# Patient Record
Sex: Female | Born: 1955 | Race: White | Hispanic: No | Smoking: Former smoker
Health system: Southern US, Community
[De-identification: ages and names within clinical notes are randomized; demographics above are authoritative.]

## PROBLEM LIST (undated history)

## (undated) DIAGNOSIS — E1110 Type 2 diabetes mellitus with ketoacidosis without coma: Secondary | ICD-10-CM

## (undated) DIAGNOSIS — C801 Malignant (primary) neoplasm, unspecified: Secondary | ICD-10-CM

## (undated) HISTORY — PX: ABDOMINAL HYSTERECTOMY: SHX81

## (undated) HISTORY — PX: MOUTH SURGERY: SHX715

---

## 2013-10-21 ENCOUNTER — Emergency Department (HOSPITAL_COMMUNITY): Payer: Medicare Other

## 2013-10-21 ENCOUNTER — Encounter (HOSPITAL_COMMUNITY): Payer: Self-pay | Admitting: Emergency Medicine

## 2013-10-21 ENCOUNTER — Inpatient Hospital Stay (HOSPITAL_COMMUNITY)
Admission: EM | Admit: 2013-10-21 | Discharge: 2013-10-24 | DRG: 069 | Disposition: A | Discharge status: Home / Self Care | Payer: Medicare Other | Attending: Internal Medicine | Admitting: Internal Medicine

## 2013-10-21 DIAGNOSIS — R29810 Facial weakness: Secondary | ICD-10-CM | POA: Diagnosis present

## 2013-10-21 DIAGNOSIS — I428 Other cardiomyopathies: Secondary | ICD-10-CM | POA: Diagnosis present

## 2013-10-21 DIAGNOSIS — Z87891 Personal history of nicotine dependence: Secondary | ICD-10-CM

## 2013-10-21 DIAGNOSIS — E039 Hypothyroidism, unspecified: Secondary | ICD-10-CM | POA: Diagnosis present

## 2013-10-21 DIAGNOSIS — G459 Transient cerebral ischemic attack, unspecified: Principal | ICD-10-CM

## 2013-10-21 DIAGNOSIS — E1049 Type 1 diabetes mellitus with other diabetic neurological complication: Secondary | ICD-10-CM | POA: Diagnosis present

## 2013-10-21 DIAGNOSIS — H538 Other visual disturbances: Secondary | ICD-10-CM | POA: Diagnosis present

## 2013-10-21 DIAGNOSIS — Z91041 Radiographic dye allergy status: Secondary | ICD-10-CM

## 2013-10-21 DIAGNOSIS — R4789 Other speech disturbances: Secondary | ICD-10-CM | POA: Diagnosis present

## 2013-10-21 DIAGNOSIS — E1159 Type 2 diabetes mellitus with other circulatory complications: Secondary | ICD-10-CM

## 2013-10-21 DIAGNOSIS — F32A Depression, unspecified: Secondary | ICD-10-CM | POA: Diagnosis present

## 2013-10-21 DIAGNOSIS — IMO0001 Reserved for inherently not codable concepts without codable children: Secondary | ICD-10-CM | POA: Diagnosis present

## 2013-10-21 DIAGNOSIS — F3289 Other specified depressive episodes: Secondary | ICD-10-CM | POA: Diagnosis present

## 2013-10-21 DIAGNOSIS — I429 Cardiomyopathy, unspecified: Secondary | ICD-10-CM

## 2013-10-21 DIAGNOSIS — I2510 Atherosclerotic heart disease of native coronary artery without angina pectoris: Secondary | ICD-10-CM | POA: Diagnosis present

## 2013-10-21 DIAGNOSIS — E1065 Type 1 diabetes mellitus with hyperglycemia: Secondary | ICD-10-CM | POA: Diagnosis present

## 2013-10-21 DIAGNOSIS — Z794 Long term (current) use of insulin: Secondary | ICD-10-CM

## 2013-10-21 DIAGNOSIS — R739 Hyperglycemia, unspecified: Secondary | ICD-10-CM

## 2013-10-21 DIAGNOSIS — E1010 Type 1 diabetes mellitus with ketoacidosis without coma: Secondary | ICD-10-CM | POA: Diagnosis present

## 2013-10-21 DIAGNOSIS — E119 Type 2 diabetes mellitus without complications: Secondary | ICD-10-CM

## 2013-10-21 DIAGNOSIS — F329 Major depressive disorder, single episode, unspecified: Secondary | ICD-10-CM | POA: Diagnosis present

## 2013-10-21 DIAGNOSIS — Z79899 Other long term (current) drug therapy: Secondary | ICD-10-CM

## 2013-10-21 DIAGNOSIS — I639 Cerebral infarction, unspecified: Secondary | ICD-10-CM | POA: Diagnosis present

## 2013-10-21 DIAGNOSIS — E1142 Type 2 diabetes mellitus with diabetic polyneuropathy: Secondary | ICD-10-CM | POA: Diagnosis present

## 2013-10-21 DIAGNOSIS — E1110 Type 2 diabetes mellitus with ketoacidosis without coma: Secondary | ICD-10-CM

## 2013-10-21 DIAGNOSIS — E11649 Type 2 diabetes mellitus with hypoglycemia without coma: Secondary | ICD-10-CM

## 2013-10-21 HISTORY — DX: Type 2 diabetes mellitus with ketoacidosis without coma: E11.10

## 2013-10-21 HISTORY — DX: Malignant (primary) neoplasm, unspecified: C80.1

## 2013-10-21 LAB — CBC WITH DIFFERENTIAL/PLATELET
Basophils Absolute: 0 10*3/uL (ref 0.0–0.1)
Basophils Relative: 0 % (ref 0–1)
EOS ABS: 0.2 10*3/uL (ref 0.0–0.7)
Eosinophils Relative: 3 % (ref 0–5)
HEMATOCRIT: 40.8 % (ref 36.0–46.0)
Hemoglobin: 13.8 g/dL (ref 12.0–15.0)
Lymphocytes Relative: 23 % (ref 12–46)
Lymphs Abs: 1.8 10*3/uL (ref 0.7–4.0)
MCH: 32 pg (ref 26.0–34.0)
MCHC: 33.8 g/dL (ref 30.0–36.0)
MCV: 94.7 fL (ref 78.0–100.0)
MONO ABS: 0.5 10*3/uL (ref 0.1–1.0)
MONOS PCT: 6 % (ref 3–12)
Neutro Abs: 5.3 10*3/uL (ref 1.7–7.7)
Neutrophils Relative %: 68 % (ref 43–77)
Platelets: 297 10*3/uL (ref 150–400)
RBC: 4.31 MIL/uL (ref 3.87–5.11)
RDW: 12.9 % (ref 11.5–15.5)
WBC: 7.9 10*3/uL (ref 4.0–10.5)

## 2013-10-21 LAB — COMPREHENSIVE METABOLIC PANEL
ALBUMIN: 4 g/dL (ref 3.5–5.2)
ALK PHOS: 86 U/L (ref 39–117)
ALT: 11 U/L (ref 0–35)
AST: 13 U/L (ref 0–37)
BUN: 12 mg/dL (ref 6–23)
CO2: 25 mEq/L (ref 19–32)
CREATININE: 0.66 mg/dL (ref 0.50–1.10)
Calcium: 9.7 mg/dL (ref 8.4–10.5)
Chloride: 95 mEq/L — ABNORMAL LOW (ref 96–112)
GFR calc non Af Amer: >90 mL/min (ref >90)
GLUCOSE: 313 mg/dL — AB (ref 70–99)
POTASSIUM: 4.1 meq/L (ref 3.7–5.3)
Sodium: 137 mEq/L (ref 137–147)
TOTAL PROTEIN: 7.8 g/dL (ref 6.0–8.3)
Total Bilirubin: <0.2 mg/dL — ABNORMAL LOW (ref 0.3–1.2)

## 2013-10-21 LAB — I-STAT TROPONIN, ED: TROPONIN I, POC: 0 ng/mL (ref 0.00–0.08)

## 2013-10-21 LAB — CBG MONITORING, ED: GLUCOSE-CAPILLARY: 320 mg/dL — AB (ref 70–99)

## 2013-10-21 MED ORDER — SODIUM CHLORIDE 0.9 % IV BOLUS (SEPSIS)
1000.0000 mL | Freq: Once | INTRAVENOUS | Status: AC
  Administered 2013-10-21: 1000 mL via INTRAVENOUS

## 2013-10-21 NOTE — ED Provider Notes (Signed)
CSN: [id number]     Arrival date & time 10/21/13  2101 History   First MD Initiated Contact with Patient 10/21/13 2126     Chief Complaint  Patient presents with  . Weakness  . Blurred Vision     (Consider location/radiation/quality/duration/timing/severity/associated sxs/prior Treatment) HPI Catherine Travis is a 58 y.o. female who presents emergency department complaining of weakness, confusion, left facial drooping which has resolved. Patient is a type II insulin-dependent diabetic, with history of coronary artery disease. patient states she woke up in the middle of the night feeling weak, shaky, dizzy. She states that she thought that her blood sugar may be low, so she drank some more juice. She did not check her blood sugar at that time. She went to bed. She woke up again in the morning with similar symptoms. Again she thought her blood sugar was low so she drinks more orange juice. She states that she has had hypoglycemic episode similar to this, and this is exactly what she felt like what her blood sugars were low in the past. She states that her symptoms continued throughout early afternoon, and she states that she also noticed that her left face was drooping. She decided to wait it out. She states she went for a nap again in afternoon, when woke up with she was feeling better. She states that her facial droop has improved, states that she is no longer feeling shaky, weak, dizzy. She states that she still "just not feeling right." States she is having little trouble focusing and formulating sentences. She also states that she is having difficulty driving because she forgets what she is doing. She states that her children made her come to the emergency department.   Past Medical History  Diagnosis Date  . Diabetic acidosis, type II   . Cancer    Past Surgical History  Procedure Laterality Date  . Abdominal hysterectomy    . Mouth surgery     No family history on file. History   Substance Use Topics  . Smoking status: Former Research scientist (life sciences)  . Smokeless tobacco: Not on file  . Alcohol Use: Yes   OB History   Grav Para Term Preterm Abortions TAB SAB Ect Mult Living                 Review of Systems  Constitutional: Negative for fever and chills.  HENT: Negative for congestion and tinnitus.   Eyes: Positive for visual disturbance.  Respiratory: Negative for cough, chest tightness and shortness of breath.   Cardiovascular: Negative for chest pain, palpitations and leg swelling.  Gastrointestinal: Negative for nausea, vomiting, abdominal pain and diarrhea.  Genitourinary: Negative for dysuria and flank pain.  Musculoskeletal: Negative for arthralgias, myalgias, neck pain and neck stiffness.  Skin: Negative for rash.  Neurological: Positive for dizziness, facial asymmetry, weakness and light-headedness. Negative for headaches.  Psychiatric/Behavioral: Positive for confusion and decreased concentration.  All other systems reviewed and are negative.      Allergies  Review of patient's allergies indicates not on file.  Home Medications   Current Outpatient Rx  Name  Route  Sig  Dispense  Refill  . FLUoxetine (PROZAC) 40 MG capsule   Oral   Take 80 mg by mouth daily.         . insulin aspart (NOVOLOG) 100 UNIT/ML injection   Subcutaneous   Inject 4-12 Units into the skin 3 (three) times daily before meals. Per sliding scale         .  insulin detemir (LEVEMIR) 100 UNIT/ML injection   Subcutaneous   Inject 22 Units into the skin 2 (two) times daily.         Marland Kitchen levothyroxine (SYNTHROID, LEVOTHROID) 25 MCG tablet   Oral   Take 25 mcg by mouth daily before breakfast.          BP 137/80  Pulse 88  Temp(Src) 98.7 F (37.1 C) (Oral)  Resp 18  Ht 5\' 5"  (1.651 m)  Wt 143 lb (64.864 kg)  BMI 23.80 kg/m2  SpO2 95% Physical Exam  Nursing note and vitals reviewed. Constitutional: She is oriented to person, place, and time. She appears well-developed  and well-nourished. No distress.  HENT:  Head: Normocephalic.  Eyes: Conjunctivae and EOM are normal. Pupils are equal, round, and reactive to light.  Neck: Neck supple.  Cardiovascular: Normal rate, regular rhythm and normal heart sounds.   Pulmonary/Chest: Effort normal and breath sounds normal. No respiratory distress. She has no wheezes. She has no rales.  Abdominal: Soft. Bowel sounds are normal. She exhibits no distension. There is no tenderness. There is no rebound.  Musculoskeletal: She exhibits no edema.  Neurological: She is alert and oriented to person, place, and time. No cranial nerve deficit. Coordination normal.  Visual fields intact. 5/5 and equal upper and lower extremity strength bilaterally. Equal grip strength bilaterally. Normal finger to nose and heel to shin. No pronator drift.    Skin: Skin is warm and dry.  Psychiatric: She has a normal mood and affect. Her behavior is normal.    ED Course  Procedures (including critical care time) Labs Review Labs Reviewed  COMPREHENSIVE METABOLIC PANEL - Abnormal; Notable for the following:    Chloride 95 (*)    Glucose, Bld 313 (*)    Total Bilirubin <0.2 (*)    All other components within normal limits  CBG MONITORING, ED - Abnormal; Notable for the following:    Glucose-Capillary 320 (*)    All other components within normal limits  URINE CULTURE  CBC WITH DIFFERENTIAL  URINALYSIS, ROUTINE W REFLEX MICROSCOPIC  I-STAT TROPOININ, ED   Imaging Review Ct Head Wo Contrast  10/21/2013   CLINICAL DATA:  Weakness, blurred lesion  EXAM: CT HEAD WITHOUT CONTRAST  TECHNIQUE: Contiguous axial images were obtained from the base of the skull through the vertex without intravenous contrast.  COMPARISON:  None.  FINDINGS: No acute intracranial hemorrhage. No focal mass lesion. No CT evidence of acute infarction. No midline shift or mass effect. No hydrocephalus. Basilar cisterns are patent.  Paranasal sinuses and mastoid air cells  are clear.  IMPRESSION: Normal head CT   Electronically Signed   By: Suzy Bouchard M.D.   On: 10/21/2013 23:24     EKG Interpretation   Date/Time:  Friday October 21 2013 21:21:03 EDT Ventricular Rate:  88 PR Interval:  162 QRS Duration: 89 QT Interval:  382 QTC Calculation: 462 R Axis:   78 Text Interpretation:  Sinus rhythm Nonspecific T wave abnormality No  previous tracing Confirmed by Ashok Cordia  MD, Lennette Bihari ([id number]) on 10/21/2013  10:14:17 PM      MDM   Final diagnoses:  TIA (transient ischemic attack)  Hyperglycemia     Patient with TIA like symptoms, versus hypoglycemia. She does have risk factors for possible CVA versus TIA. Currently her neurological exam is normal. She is hyperglycemic at 320. Will get blood work, CT head, will monitor.  12:50 AM CT head, labs unremarkable. I have given her some IV  fluids for her hyperglycemia. Given her risk factors, will admit for TIA workup. Discussed results with patient and she increased to plan. Continues to be asymptomatic in ED.  Filed Vitals:   10/21/13 2114  BP: 137/80  Pulse: 88  Temp: 98.7 F (37.1 C)  TempSrc: Oral  Resp: 18  Height: 5\' 5"  (1.651 m)  Weight: 143 lb (64.864 kg)  SpO2: 95%      Renold Genta, PA-C 10/22/13 0051

## 2013-10-21 NOTE — ED Notes (Signed)
Pt arrived to the ED with a complaint of weakness. Pt was feeling weak when she woke up.  Pt is an insulin dependent diabetic and this am her blood sugar was low.  Pt states she was having difficulty focusing and claimed she was confused and fuzzy.  Pt attempted to take a trip but was unable to focus on the road so she ate again and then returned home where the symptoms persisted.  CBG is 320 at present.

## 2013-10-22 ENCOUNTER — Encounter (HOSPITAL_COMMUNITY): Payer: Self-pay | Admitting: *Deleted

## 2013-10-22 ENCOUNTER — Inpatient Hospital Stay (HOSPITAL_COMMUNITY): Payer: Medicare Other

## 2013-10-22 DIAGNOSIS — E1159 Type 2 diabetes mellitus with other circulatory complications: Secondary | ICD-10-CM

## 2013-10-22 DIAGNOSIS — E119 Type 2 diabetes mellitus without complications: Secondary | ICD-10-CM

## 2013-10-22 DIAGNOSIS — E1110 Type 2 diabetes mellitus with ketoacidosis without coma: Secondary | ICD-10-CM | POA: Diagnosis present

## 2013-10-22 DIAGNOSIS — I639 Cerebral infarction, unspecified: Secondary | ICD-10-CM | POA: Diagnosis present

## 2013-10-22 DIAGNOSIS — F32A Depression, unspecified: Secondary | ICD-10-CM | POA: Diagnosis present

## 2013-10-22 DIAGNOSIS — R7309 Other abnormal glucose: Secondary | ICD-10-CM

## 2013-10-22 DIAGNOSIS — Z794 Long term (current) use of insulin: Secondary | ICD-10-CM

## 2013-10-22 DIAGNOSIS — I6350 Cerebral infarction due to unspecified occlusion or stenosis of unspecified cerebral artery: Secondary | ICD-10-CM

## 2013-10-22 DIAGNOSIS — F3289 Other specified depressive episodes: Secondary | ICD-10-CM

## 2013-10-22 DIAGNOSIS — IMO0001 Reserved for inherently not codable concepts without codable children: Secondary | ICD-10-CM | POA: Diagnosis present

## 2013-10-22 DIAGNOSIS — G459 Transient cerebral ischemic attack, unspecified: Principal | ICD-10-CM

## 2013-10-22 DIAGNOSIS — F329 Major depressive disorder, single episode, unspecified: Secondary | ICD-10-CM | POA: Diagnosis present

## 2013-10-22 LAB — URINE MICROSCOPIC-ADD ON

## 2013-10-22 LAB — GLUCOSE, CAPILLARY
GLUCOSE-CAPILLARY: 111 mg/dL — AB (ref 70–99)
GLUCOSE-CAPILLARY: 310 mg/dL — AB (ref 70–99)
Glucose-Capillary: 255 mg/dL — ABNORMAL HIGH (ref 70–99)
Glucose-Capillary: 147 mg/dL — ABNORMAL HIGH (ref 70–99)
Glucose-Capillary: 36 mg/dL — CL (ref 70–99)
Glucose-Capillary: 79 mg/dL (ref 70–99)
Glucose-Capillary: 332 mg/dL — ABNORMAL HIGH (ref 70–99)
Glucose-Capillary: 194 mg/dL — ABNORMAL HIGH (ref 70–99)

## 2013-10-22 LAB — LIPID PANEL
Cholesterol: 141 mg/dL (ref 0–200)
HDL: 53 mg/dL (ref >39)
LDL CALC: 67 mg/dL (ref 0–99)
Total CHOL/HDL Ratio: 2.7 RATIO
Triglycerides: 103 mg/dL (ref <150)
VLDL: 21 mg/dL (ref 0–40)

## 2013-10-22 LAB — URINALYSIS, ROUTINE W REFLEX MICROSCOPIC
BILIRUBIN URINE: NEGATIVE
Hgb urine dipstick: NEGATIVE
KETONES UR: 15 mg/dL — AB
NITRITE: NEGATIVE
PH: 7 (ref 5.0–8.0)
Protein, ur: NEGATIVE mg/dL
SPECIFIC GRAVITY, URINE: 1.022 (ref 1.005–1.030)
Urobilinogen, UA: 0.2 mg/dL (ref 0.0–1.0)

## 2013-10-22 LAB — HEMOGLOBIN A1C
Hgb A1c MFr Bld: 10.5 % — ABNORMAL HIGH (ref <5.7)
Mean Plasma Glucose: 255 mg/dL — ABNORMAL HIGH (ref <117)

## 2013-10-22 LAB — TSH: TSH: 1.769 u[IU]/mL (ref 0.350–4.500)

## 2013-10-22 MED ORDER — ASPIRIN 325 MG PO TABS
325.0000 mg | ORAL_TABLET | Freq: Every day | ORAL | Status: DC
  Administered 2013-10-22 – 2013-10-23 (×2): 325 mg via ORAL
  Filled 2013-10-22 (×2): qty 1

## 2013-10-22 MED ORDER — INSULIN ASPART 100 UNIT/ML ~~LOC~~ SOLN
3.0000 [IU] | Freq: Three times a day (TID) | SUBCUTANEOUS | Status: DC
  Administered 2013-10-23 – 2013-10-24 (×5): 3 [IU] via SUBCUTANEOUS

## 2013-10-22 MED ORDER — INSULIN DETEMIR 100 UNIT/ML ~~LOC~~ SOLN
18.0000 [IU] | Freq: Two times a day (BID) | SUBCUTANEOUS | Status: DC
  Administered 2013-10-22 (×2): 18 [IU] via SUBCUTANEOUS
  Filled 2013-10-22 (×3): qty 0.18

## 2013-10-22 MED ORDER — ONDANSETRON HCL 4 MG/2ML IJ SOLN: 4.0000 mg | Freq: Three times a day (TID) | INTRAMUSCULAR | Status: AC | PRN

## 2013-10-22 MED ORDER — DEXTROSE 50 % IV SOLN
25.0000 mL | Freq: Once | INTRAVENOUS | Status: AC | PRN
Start: 2013-10-22 — End: 2013-10-22
  Administered 2013-10-22: 25 mL via INTRAVENOUS

## 2013-10-22 MED ORDER — SODIUM CHLORIDE 0.9 % IV SOLN
INTRAVENOUS | Status: DC
  Administered 2013-10-22: 07:00:00 via INTRAVENOUS

## 2013-10-22 MED ORDER — SENNOSIDES-DOCUSATE SODIUM 8.6-50 MG PO TABS: 1.0000 | ORAL_TABLET | Freq: Every evening | ORAL | Status: DC | PRN

## 2013-10-22 MED ORDER — INSULIN ASPART 100 UNIT/ML ~~LOC~~ SOLN
15.0000 [IU] | Freq: Once | SUBCUTANEOUS | Status: AC
  Administered 2013-10-22: 15 [IU] via SUBCUTANEOUS

## 2013-10-22 MED ORDER — FLUOXETINE HCL 20 MG PO CAPS
80.0000 mg | ORAL_CAPSULE | Freq: Every day | ORAL | Status: DC
  Administered 2013-10-22: 80 mg via ORAL
  Filled 2013-10-22: qty 4

## 2013-10-22 MED ORDER — INSULIN ASPART 100 UNIT/ML ~~LOC~~ SOLN
0.0000 [IU] | Freq: Every day | SUBCUTANEOUS | Status: DC
  Administered 2013-10-23: 2 [IU] via SUBCUTANEOUS

## 2013-10-22 MED ORDER — FLUOXETINE HCL 20 MG PO CAPS
40.0000 mg | ORAL_CAPSULE | Freq: Every day | ORAL | Status: DC
  Administered 2013-10-23 – 2013-10-24 (×2): 40 mg via ORAL
  Filled 2013-10-22 (×2): qty 2

## 2013-10-22 MED ORDER — LEVOTHYROXINE SODIUM 25 MCG PO TABS
25.0000 ug | ORAL_TABLET | Freq: Every day | ORAL | Status: DC
  Administered 2013-10-22 – 2013-10-24 (×3): 25 ug via ORAL
  Filled 2013-10-22 (×5): qty 1

## 2013-10-22 MED ORDER — INSULIN ASPART 100 UNIT/ML ~~LOC~~ SOLN
0.0000 [IU] | SUBCUTANEOUS | Status: DC
  Administered 2013-10-22: 3 [IU] via SUBCUTANEOUS

## 2013-10-22 MED ORDER — DEXTROSE 50 % IV SOLN
INTRAVENOUS | Status: AC
  Administered 2013-10-22: 50 mL
  Filled 2013-10-22: qty 50

## 2013-10-22 MED ORDER — SODIUM CHLORIDE 0.9 % IV BOLUS (SEPSIS)
2000.0000 mL | Freq: Once | INTRAVENOUS | Status: AC
  Administered 2013-10-22: 2000 mL via INTRAVENOUS

## 2013-10-22 MED ORDER — INSULIN ASPART 100 UNIT/ML ~~LOC~~ SOLN: 0.0000 [IU] | SUBCUTANEOUS | Status: DC

## 2013-10-22 MED ORDER — INSULIN ASPART 100 UNIT/ML ~~LOC~~ SOLN: 12.0000 [IU] | Freq: Once | SUBCUTANEOUS | Status: DC

## 2013-10-22 MED ORDER — ENOXAPARIN SODIUM 40 MG/0.4ML ~~LOC~~ SOLN
40.0000 mg | SUBCUTANEOUS | Status: DC
  Administered 2013-10-22 – 2013-10-24 (×3): 40 mg via SUBCUTANEOUS
  Filled 2013-10-22 (×3): qty 0.4

## 2013-10-22 MED ORDER — INSULIN ASPART 100 UNIT/ML ~~LOC~~ SOLN
0.0000 [IU] | Freq: Three times a day (TID) | SUBCUTANEOUS | Status: DC
  Administered 2013-10-22 (×2): 7 [IU] via SUBCUTANEOUS
  Administered 2013-10-23: 5 [IU] via SUBCUTANEOUS
  Administered 2013-10-23: 3 [IU] via SUBCUTANEOUS
  Administered 2013-10-24: 7 [IU] via SUBCUTANEOUS
  Administered 2013-10-24: 2 [IU] via SUBCUTANEOUS

## 2013-10-22 NOTE — H&P (Signed)
[facility] History and Physical  Catherine Travis TOI:[id number] DOB: Nov 16, 1955 DOA: 10/21/2013  Referring physician:  PCP: No primary provider on file.  Specialists:   Chief Complaint: Acute onset facial drooping  HPI: Catherine Travis is a 58 y.o. WF PMHx depression, diabetes type II uncontrolled, hypothyroidism presents emergency department complaining of weakness, confusion, left facial drooping which has resolved. Patient is a type II insulin-dependent diabetic, with history of coronary artery disease. patient states she woke up in the middle of the night feeling weak, shaky, dizzy. She states that she thought that her blood sugar may be low, so she drank some more juice. She did not check her blood sugar at that time. She went to bed. She woke up again in the morning with similar symptoms. Again she thought her blood sugar was low so she drinks more orange juice. She states that she has had hypoglycemic episode similar to this, and this is exactly what she felt like what her blood sugars were low in the past. She states that her symptoms continued throughout early afternoon, and she states that she also noticed that her left face was drooping. She decided to wait it out. She states she went for a nap again in afternoon, when woke up with she was feeling better. She states that her facial droop has improved, states that she is no longer feeling shaky, weak, dizzy. She states that she still "just not feeling right." States she is having little trouble focusing and formulating sentences. She also states that she is having difficulty driving because she forgets what she is doing. She states that her children made her come to the emergency department. Admission anion gap= 17    Review of Systems: The patient denies anorexia, fever, weight loss,, vision loss, decreased hearing, hoarseness, chest pain, syncope, dyspnea on exertion, peripheral edema, balance deficits, hemoptysis, abdominal  pain, melena, hematochezia, severe indigestion/heartburn, hematuria, incontinence, genital sores, muscle weakness, suspicious skin lesions, transient blindness, difficulty walking,unusual weight change, abnormal bleeding, enlarged lymph nodes, angioedema, and breast masses.    TRAVEL HISTORY:   Procedure CT head without contrast 10/21/2013 Normal head CT   Antibiotics   Consultation    Past Medical History  Diagnosis Date  . Diabetic acidosis, type II   . Cancer    Past Surgical History  Procedure Laterality Date  . Abdominal hysterectomy    . Mouth surgery     Social History:  reports that she has quit smoking. She does not have any smokeless tobacco history on file. She reports that she drinks alcohol. She reports that she does not use illicit drugs. where does patient live--home, ALF, SNF? Home alone   Can patient participate in ADLs? Yes  Allergies  Allergen Reactions  . Contrast Media [Iodinated Diagnostic Agents] Anaphylaxis    History reviewed. No pertinent family history.   Prior to Admission medications   Medication Sig Start Date End Date Taking? Authorizing Provider  FLUoxetine (PROZAC) 40 MG capsule Take 80 mg by mouth daily.   Yes Historical Provider, MD  insulin aspart (NOVOLOG) 100 UNIT/ML injection Inject 4-12 Units into the skin 3 (three) times daily before meals. Per sliding scale   Yes Historical Provider, MD  insulin detemir (LEVEMIR) 100 UNIT/ML injection Inject 22 Units into the skin 2 (two) times daily.   Yes Historical Provider, MD  levothyroxine (SYNTHROID, LEVOTHROID) 25 MCG tablet Take 25 mcg by mouth daily before breakfast.   Yes Historical Provider, MD   Physical Exam: Filed Vitals:  10/21/13 2114 10/22/13 0049 10/22/13 0145  BP: 137/80    Pulse: 88    Temp: 98.7 F (37.1 C) 98.8 F (37.1 C)   TempSrc: Oral    Resp: 18    Height: 5\' 5"  (1.651 m)  5\' 5"  (1.651 m)  Weight: 64.864 kg (143 lb)  67.8 kg (149 lb 7.6 oz)  SpO2: 95%        General:  A./O. x4, NAD  Eyes: Pupils equal round reactive to light and accommodation  Neck: Negative JVD, negative lymphadenopathy  Cardiovascular: Regular rhythm and rate, negative murmurs rubs or gallops  Respiratory: Clear to auscultation bilateral  Abdomen: Soft, nontender, nondistended, plus bowel sounds  Musculoskeletal: Negative pedal edema  Neurologic: Cranial nerve II through XII intact, tongue/school the midline, extremity strength 5/5, finger-nose-finger within normal limit, negative pronator drift, negative Romberg, unable to stand on the left leg, loss of soft touch entire left side (hemiparesthesia),  Labs on Admission:  Basic Metabolic Panel:  Recent Labs Lab 10/21/13 2246  NA 137  K 4.1  CL 95*  CO2 25  GLUCOSE 313*  BUN 12  CREATININE 0.66  CALCIUM 9.7   Liver Function Tests:  Recent Labs Lab 10/21/13 2246  AST 13  ALT 11  ALKPHOS 86  BILITOT <0.2*  PROT 7.8  ALBUMIN 4.0   No results found for this basename: LIPASE, AMYLASE,  in the last 168 hours No results found for this basename: AMMONIA,  in the last 168 hours CBC:  Recent Labs Lab 10/21/13 2246  WBC 7.9  NEUTROABS 5.3  HGB 13.8  HCT 40.8  MCV 94.7  PLT 297   Cardiac Enzymes: No results found for this basename: CKTOTAL, CKMB, CKMBINDEX, TROPONINI,  in the last 168 hours  BNP (last 3 results) No results found for this basename: PROBNP,  in the last 8760 hours CBG:  Recent Labs Lab 10/21/13 2123  GLUCAP 320*    Radiological Exams on Admission: Ct Head Wo Contrast  10/21/2013   CLINICAL DATA:  Weakness, blurred lesion  EXAM: CT HEAD WITHOUT CONTRAST  TECHNIQUE: Contiguous axial images were obtained from the base of the skull through the vertex without intravenous contrast.  COMPARISON:  None.  FINDINGS: No acute intracranial hemorrhage. No focal mass lesion. No CT evidence of acute infarction. No midline shift or mass effect. No hydrocephalus. Basilar cisterns are  patent.  Paranasal sinuses and mastoid air cells are clear.  IMPRESSION: Normal head CT   Electronically Signed   By: Suzy Bouchard M.D.   On: 10/21/2013 23:24    EKG:   Assessment/Plan Active Problems:   TIA (transient ischemic attack)   Stroke   Type II or unspecified type diabetes mellitus with peripheral circulatory disorders, uncontrolled(250.72)   DKA (diabetic ketoacidoses)   Depression   DKA -Admission AG = 17 (mild case)  -Bolus 2 L normal saline then run normal saline at 156ml/hr -NovoLog 12 units, then resistant SSI -Recheck CMP, magnesium in a.m. -Obtain hemoglobin A1c -Obtain lipid panel  Diabetes type 2 uncontrolled -See DKA  Stroke/TIA -Obtain MRI/MRA -Obtain carotid Dopplers -Obtain echocardiogram  Depression -Continue home medication     Code Status: Full Family Communication:  none Disposition Plan: Completion of stroke workup resolution of DKA  Time spent: 60 minutes  Laynie Espy, Geraldo Docker [facility] Pager [phone]  If 7PM-7AM, please contact night-coverage [email or web address] Password [email or web address] 10/22/2013, 2:07 AM

## 2013-10-22 NOTE — Progress Notes (Signed)
UR completed 

## 2013-10-22 NOTE — Progress Notes (Signed)
Hypoglycemic Event  CBG: 36  Treatment: D50 IV 25 mL  And 4oz orange juice    Symptoms: shaky, sweaty, and pale    Follow-up CBG: Time:0600 CBG Result: 111  Possible Reasons for Event: Medication regimen. Pt received one time dose of 15 units of novolog at 0251hen getting a sliding scale coverage of 3 units of novolog at 0425 :Comments/MD notified: T. [place], changed sliding scale to a moderate sliding scale.     Catherine Travis E  Remember to initiate Hypoglycemia Order Set & complete

## 2013-10-22 NOTE — Progress Notes (Signed)
*  PRELIMINARY RESULTS* Vascular Ultrasound Carotid Duplex (Doppler) has been completed.  Preliminary findings: Bilateral:  1-39% ICA stenosis.  Vertebral artery flow is antegrade.      Landry Mellow, RDMS, RVT  10/22/2013, 8:38 AM

## 2013-10-22 NOTE — Progress Notes (Addendum)
PROGRESS NOTE    Catherine Travis WIO:[id number] DOB: 1955-10-26 DOA: 10/21/2013 PCP: No primary provider on file.  HPI/Brief narrative 58 year old female patient with history of DM 2/IDDM with peripheral neuropathy, depression, hypothyroidism, who was admitted on 10/22/13 with frequent "hypoglycemic-like episodes" and facial asymmetry and slurred speech. Her PCP is in [place], [place] and patient moved to the [place] area approximately a year ago but travels back to [place] every 3 months and has not found her PCP here yet. She gives history of brittle diabetes. Has been on current regimen of insulins for approximately 6 months with no recent changes. She is stressed by ongoing divorce proceedings and claims decreased oral intake associated with frequent hypoglycemic episodes over the last few weeks. She does not check her blood sugars regularly but "knows" when she has a hypoglycemic spell. On 10/21/13, she woke up at 4:30 AM with complaints of sweating, feeling like her blood sugars were low then drank a glass of orange juice which she keeps next to her bed. An hour later, she had a similar episode and repeated the same measure. 3 hours later, she ate some breakfast at [employer] but continued to feel unwell, but blurry vision and noticed left-sided facial asymmetry and her daughter suggested that she had slurred speech while talking on the phone. She returned home and slept for a few hours but still was not feeling right and presented to the ED.    Assessment/Plan:  1. Uncontrolled DM 2 with peripheral neuropathy and hypoglycemic episodes: Gives history of frequent hypoglycemic episodes lately secondary to poor oral intake. We'll reduce Levemir to 18 units twice a day and continue SSI. Check hemoglobin A1c. Monitor closely. Patient is looking for a new PCP in town-case management consultation on Monday. 2. Possible CVA versus TIA: Versus all symptoms secondary to hypoglycemia. Complete stroke  workup. Aspirin for secondary stroke prophylaxis. CT head negative. 3. Depression: Patient states that she takes Prozac 40 mg daily-we'll change. 4. Hypothyroidism: Continue Synthroid. TSH normal.   Code Status:  Full Family Communication:  None at bedside Disposition Plan:  Home when medically stable   Consultants:   None  Procedures:   None  Antibiotics:   None   Subjective:  Feels that her slurred speech has resolved. Unable to say if her facial asymmetry persists. Some unsteadiness in her left hand. Had a hypoglycemic episode early this morning.  Objective: Filed Vitals:   10/22/13 0426 10/22/13 1000 10/22/13 1206 10/22/13 1356  BP: 111/50 129/63 136/64 125/83  Pulse: 87 98 90 90  Temp: 98.3 F (36.8 C)  98.6 F (37 C) 97.8 F (36.6 C)  TempSrc: Oral  Oral Oral  Resp: 20 18 16 16   Height:      Weight:      SpO2: 95% 95% 95% 96%    Intake/Output Summary (Last 24 hours) at 10/22/13 1518 Last data filed at 10/22/13 1300  Gross per 24 hour  Intake    720 ml  Output      0 ml  Net    720 ml   Filed Weights   10/21/13 2114 10/22/13 0145  Weight: 64.864 kg (143 lb) 67.8 kg (149 lb 7.6 oz)     Exam:  General exam:  Pleasant middle-aged female lying comfortably in bed. Respiratory system: Clear. No increased work of breathing. Cardiovascular system: S1 & S2 heard, RRR. No JVD, murmurs, gallops, clicks or pedal edema. Tele: SR Gastrointestinal system: Abdomen is nondistended, soft and nontender. Normal bowel sounds heard.  Central nervous system: Alert and oriented. ? Less prominent right nasolabial fold compared to right. No other anemia deficits. No pronator drift. Extremities:  5 x 5 power right limbs.? 4+/5 power in left limbs (states that she has some chronic left lower extremity weakness).   Data Reviewed: Basic Metabolic Panel:  Recent Labs Lab 10/21/13 2246  NA 137  K 4.1  CL 95*  CO2 25  GLUCOSE 313*  BUN 12  CREATININE 0.66  CALCIUM 9.7     Liver Function Tests:  Recent Labs Lab 10/21/13 2246  AST 13  ALT 11  ALKPHOS 86  BILITOT <0.2*  PROT 7.8  ALBUMIN 4.0   No results found for this basename: LIPASE, AMYLASE,  in the last 168 hours No results found for this basename: AMMONIA,  in the last 168 hours CBC:  Recent Labs Lab 10/21/13 2246  WBC 7.9  NEUTROABS 5.3  HGB 13.8  HCT 40.8  MCV 94.7  PLT 297   Cardiac Enzymes: No results found for this basename: CKTOTAL, CKMB, CKMBINDEX, TROPONINI,  in the last 168 hours BNP (last 3 results) No results found for this basename: PROBNP,  in the last 8760 hours CBG:  Recent Labs Lab 10/22/13 0421 10/22/13 0538 10/22/13 0559 10/22/13 0747 10/22/13 1204  GLUCAP 147* 36* 111* 79 332*    No results found for this or any previous visit (from the past 240 hour(s)).    Additional labs: 1.  Fasting lipids: Cholesterol 141, triglycerides 103, HDL 53, LDL 67 and VLDL 21 2. TSH: 1.769     Studies: Dg Chest 2 View  10/22/2013   CLINICAL DATA:  Weakness, stroke  EXAM: CHEST  2 VIEW  COMPARISON:  None.  FINDINGS: The heart size and mediastinal contours are within normal limits. Both lungs are clear. The visualized skeletal structures are unremarkable.  IMPRESSION: No active cardiopulmonary disease.   Electronically Signed   By: Kathreen Devoid   On: 10/22/2013 08:47   Ct Head Wo Contrast  10/21/2013   CLINICAL DATA:  Weakness, blurred lesion  EXAM: CT HEAD WITHOUT CONTRAST  TECHNIQUE: Contiguous axial images were obtained from the base of the skull through the vertex without intravenous contrast.  COMPARISON:  None.  FINDINGS: No acute intracranial hemorrhage. No focal mass lesion. No CT evidence of acute infarction. No midline shift or mass effect. No hydrocephalus. Basilar cisterns are patent.  Paranasal sinuses and mastoid air cells are clear.  IMPRESSION: Normal head CT   Electronically Signed   By: Suzy Bouchard M.D.   On: 10/21/2013 23:24        Scheduled  Meds: . enoxaparin (LOVENOX) injection  40 mg Subcutaneous Q24H  . FLUoxetine  80 mg Oral Daily  . insulin aspart  0-5 Units Subcutaneous QHS  . insulin aspart  0-9 Units Subcutaneous TID WC  . insulin detemir  18 Units Subcutaneous BID  . levothyroxine  25 mcg Oral QAC breakfast   Continuous Infusions:    Active Problems:   TIA (transient ischemic attack)   Stroke   Type II or unspecified type diabetes mellitus with peripheral circulatory disorders, uncontrolled(250.72)   DKA (diabetic ketoacidoses)   Depression    Time spent:  4 minutes    HONGALGI,ANAND, MD, FACP, FHM. [facility] Pager [phone]  If 7PM-7AM, please contact night-coverage [email or web address] Password [email or web address] 10/22/2013, 3:18 PM    LOS: 1 day

## 2013-10-23 ENCOUNTER — Inpatient Hospital Stay (HOSPITAL_COMMUNITY): Payer: Medicare Other

## 2013-10-23 DIAGNOSIS — I6789 Other cerebrovascular disease: Secondary | ICD-10-CM

## 2013-10-23 DIAGNOSIS — E1169 Type 2 diabetes mellitus with other specified complication: Secondary | ICD-10-CM

## 2013-10-23 DIAGNOSIS — I428 Other cardiomyopathies: Secondary | ICD-10-CM

## 2013-10-23 LAB — GLUCOSE, CAPILLARY
GLUCOSE-CAPILLARY: 45 mg/dL — AB (ref 70–99)
Glucose-Capillary: 80 mg/dL (ref 70–99)
Glucose-Capillary: 134 mg/dL — ABNORMAL HIGH (ref 70–99)
Glucose-Capillary: 267 mg/dL — ABNORMAL HIGH (ref 70–99)
Glucose-Capillary: 219 mg/dL — ABNORMAL HIGH (ref 70–99)
Glucose-Capillary: 243 mg/dL — ABNORMAL HIGH (ref 70–99)

## 2013-10-23 LAB — URINE CULTURE: Colony Count: 80000

## 2013-10-23 MED ORDER — INSULIN DETEMIR 100 UNIT/ML ~~LOC~~ SOLN
20.0000 [IU] | Freq: Every day | SUBCUTANEOUS | Status: DC
  Filled 2013-10-23: qty 0.2

## 2013-10-23 MED ORDER — INSULIN DETEMIR 100 UNIT/ML ~~LOC~~ SOLN
18.0000 [IU] | Freq: Every day | SUBCUTANEOUS | Status: DC
  Administered 2013-10-23 – 2013-10-24 (×2): 18 [IU] via SUBCUTANEOUS
  Filled 2013-10-23 (×2): qty 0.18

## 2013-10-23 MED ORDER — INSULIN DETEMIR 100 UNIT/ML ~~LOC~~ SOLN
14.0000 [IU] | Freq: Every day | SUBCUTANEOUS | Status: DC
  Administered 2013-10-23: 14 [IU] via SUBCUTANEOUS
  Filled 2013-10-23 (×2): qty 0.14

## 2013-10-23 MED ORDER — DEXTROSE 50 % IV SOLN
INTRAVENOUS | Status: AC
  Administered 2013-10-23: 25 mL
  Filled 2013-10-23: qty 50

## 2013-10-23 MED ORDER — ASPIRIN EC 81 MG PO TBEC
81.0000 mg | DELAYED_RELEASE_TABLET | Freq: Every day | ORAL | Status: DC
  Administered 2013-10-24: 81 mg via ORAL
  Filled 2013-10-23: qty 1

## 2013-10-23 MED ORDER — INSULIN DETEMIR 100 UNIT/ML ~~LOC~~ SOLN: 16.0000 [IU] | Freq: Every day | SUBCUTANEOUS | Status: DC

## 2013-10-23 NOTE — Progress Notes (Signed)
I have reviewed this note and agree with all findings. Kati Nazire Fruth, PT, DPT Pager: [phone]   

## 2013-10-23 NOTE — Progress Notes (Signed)
  Echocardiogram 2D Echocardiogram has been performed.  Catherine Travis 10/23/2013, 11:20 AM

## 2013-10-23 NOTE — Progress Notes (Signed)
OT Cancellation Note  Patient Details Name: Catherine Travis MRN: [id number] DOB: 05/27/1956   Cancelled Treatment:    Reason Eval/Treat Not Completed: OT screened, no needs identified, will sign off  Cadel Stairs A 10/23/2013, 10:36 AM

## 2013-10-23 NOTE — Progress Notes (Signed)
PROGRESS NOTE    Catherine Travis BOF:[id number] DOB: 08-27-1955 DOA: 10/21/2013 PCP: No primary provider on file.  HPI/Brief narrative 58 year old female patient with history of DM 2/IDDM with peripheral neuropathy, depression, hypothyroidism, who was admitted on 10/22/13 with frequent "hypoglycemic-like episodes" and facial asymmetry and slurred speech. Her PCP is in [place], [place] and patient moved to the [place] area approximately a year ago but travels back to [place] every 3 months and has not found her PCP here yet. She gives history of brittle diabetes. Has been on current regimen of insulins for approximately 6 months with no recent changes. She is stressed by ongoing divorce proceedings and claims decreased oral intake associated with frequent hypoglycemic episodes over the last few weeks. She does not check her blood sugars regularly but "knows" when she has a hypoglycemic spell. On 10/21/13, she woke up at 4:30 AM with complaints of sweating, feeling like her blood sugars were low then drank a glass of orange juice which she keeps next to her bed. An hour later, she had a similar episode and repeated the same measure. 3 hours later, she ate some breakfast at [employer] but continued to feel unwell, but blurry vision and noticed left-sided facial asymmetry and her daughter suggested that she had slurred speech while talking on the phone. She returned home and slept for a few hours but still was not feeling right and presented to the ED.    Assessment/Plan:  1. Uncontrolled DM 2 with peripheral neuropathy and hypoglycemic episodes: Gives history of frequent hypoglycemic episodes lately secondary to poor oral intake. Hemoglobin A1c: 10.5. Patient is looking for a new PCP in town-case management consultation on Monday. Had adjusted down Levemir on 3/21, despite which she had hypoglycemic episode this morning. We'll reduce Levemir to 18 units daily and 14 units each bedtime. Added NovoLog  3 units 3 times a day with meals. Continue sensitive SSI. Monitor closely. 2. TIA: Stroke workup completed. MRI & MRA brain: Normal. Carotid Dopplers: Bilateral 1-39% ICA stenosis and vertebral artery flow is antegrade. 2-D echo: LVEF 40-45% and mild hypokinesis. Hemoglobin A1c: 10.5. LDL: 67. Change aspirin 81 mg daily for secondary stroke prophylaxis-states that she has had some epistaxis in the past when taking along with Prozac-monitor closely. CT head negative. 3. Depression: Patient states that she takes Prozac 40 mg daily-we'll change. 4. Hypothyroidism: Continue Synthroid. TSH normal. 5. Cardiomyopathy: LVEF 40-45% and mild hypokinesis. Will consult cardiology. Consider ACE inhibitor/ARB   Code Status:  Full Family Communication:  None at bedside Disposition Plan:  Home when medically stable   Consultants:   None  Procedures:   None  Antibiotics:   None   Subjective: Mild persisting facial asymmetry. No further slurred speech. Still feels clumsy in her left hand.  Objective: Filed Vitals:   10/23/13 0231 10/23/13 0530 10/23/13 1015 10/23/13 1330  BP: 116/66 98/57 127/66 138/72  Pulse: 82 72 73 91  Temp: 98.3 F (36.8 C) 98.1 F (36.7 C) 97.9 F (36.6 C) 97.9 F (36.6 C)  TempSrc: Oral Oral Oral Oral  Resp: 20 20 20 20   Height:      Weight:      SpO2: 99% 96% 98% 98%    Intake/Output Summary (Last 24 hours) at 10/23/13 1357 Last data filed at 10/23/13 1329  Gross per 24 hour  Intake    840 ml  Output   3575 ml  Net  -2735 ml   Filed Weights   10/21/13 2114 10/22/13 0145  Weight:  64.864 kg (143 lb) 67.8 kg (149 lb 7.6 oz)     Exam:  General exam:  Pleasant middle-aged female lying comfortably in bed. Respiratory system: Clear. No increased work of breathing. Cardiovascular system: S1 & S2 heard, RRR. No JVD, murmurs, gallops, clicks or pedal edema. Tele: SR Gastrointestinal system: Abdomen is nondistended, soft and nontender. Normal bowel sounds  heard. Central nervous system: Alert and oriented. ? Less prominent right nasolabial fold compared to right. No other anemia deficits. No pronator drift. Extremities:  5 x 5 power right limbs.? 4+/5 power in left limbs (states that she has some chronic left lower extremity weakness).   Data Reviewed: Basic Metabolic Panel:  Recent Labs Lab 10/21/13 2246  NA 137  K 4.1  CL 95*  CO2 25  GLUCOSE 313*  BUN 12  CREATININE 0.66  CALCIUM 9.7   Liver Function Tests:  Recent Labs Lab 10/21/13 2246  AST 13  ALT 11  ALKPHOS 86  BILITOT <0.2*  PROT 7.8  ALBUMIN 4.0   No results found for this basename: LIPASE, AMYLASE,  in the last 168 hours No results found for this basename: AMMONIA,  in the last 168 hours CBC:  Recent Labs Lab 10/21/13 2246  WBC 7.9  NEUTROABS 5.3  HGB 13.8  HCT 40.8  MCV 94.7  PLT 297   Cardiac Enzymes: No results found for this basename: CKTOTAL, CKMB, CKMBINDEX, TROPONINI,  in the last 168 hours BNP (last 3 results) No results found for this basename: PROBNP,  in the last 8760 hours CBG:  Recent Labs Lab 10/22/13 1555 10/22/13 2221 10/23/13 0528 10/23/13 0753 10/23/13 0831  GLUCAP 310* 194* 80 45* 134*    Recent Results (from the past 240 hour(s))  URINE CULTURE     Status: None   Collection Time    10/22/13  1:14 AM      Result Value Ref Range Status   Specimen Description URINE, RANDOM   Final   Special Requests NONE   Final   Culture  Setup Time     Final   Value: 10/22/2013 05:45     Performed at [facility]     Final   Value: 80,000 COLONIES/ML     Performed at [facility]   Culture     Final   Value: Multiple bacterial morphotypes present, none predominant. Suggest appropriate recollection if clinically indicated.     Performed at [facility]   Report Status 10/23/2013 FINAL   Final      Additional labs: 1.  Fasting lipids: Cholesterol 141, triglycerides 103, HDL 53, LDL 67  and VLDL 21 2. TSH: 1.769     Studies: Dg Chest 2 View  10/22/2013   CLINICAL DATA:  Weakness, stroke  EXAM: CHEST  2 VIEW  COMPARISON:  None.  FINDINGS: The heart size and mediastinal contours are within normal limits. Both lungs are clear. The visualized skeletal structures are unremarkable.  IMPRESSION: No active cardiopulmonary disease.   Electronically Signed   By: Kathreen Devoid   On: 10/22/2013 08:47   Ct Head Wo Contrast  10/21/2013   CLINICAL DATA:  Weakness, blurred lesion  EXAM: CT HEAD WITHOUT CONTRAST  TECHNIQUE: Contiguous axial images were obtained from the base of the skull through the vertex without intravenous contrast.  COMPARISON:  None.  FINDINGS: No acute intracranial hemorrhage. No focal mass lesion. No CT evidence of acute infarction. No midline shift or mass effect. No hydrocephalus.  Basilar cisterns are patent.  Paranasal sinuses and mastoid air cells are clear.  IMPRESSION: Normal head CT   Electronically Signed   By: Suzy Bouchard M.D.   On: 10/21/2013 23:24   Mr Jodene Nam Head Wo Contrast  10/23/2013   CLINICAL DATA:  Stroke or TIA.  Weakness and blurred vision.  EXAM: MRI HEAD WITHOUT CONTRAST  MRA HEAD WITHOUT CONTRAST  TECHNIQUE: Multiplanar, multiecho pulse sequences of the brain and surrounding structures were obtained without intravenous contrast. Angiographic images of the head were obtained using MRA technique without contrast.  COMPARISON:  Head CT 10/21/2013  FINDINGS: MRI HEAD FINDINGS  Diffusion imaging does not show any acute or subacute infarction. The brainstem and cerebellum are normal. The cerebral hemispheres are normal without evidence of old or acute infarction, mass lesion, hemorrhage, hydrocephalus or extra-axial collection. No pituitary mass. No inflammatory sinus disease. Orbits appear unremarkable. No skull or skullbase lesion.  MRA HEAD FINDINGS  Both internal carotid arteries are widely patent into the brain. The anterior and middle cerebral vessels  are patent without proximal stenosis, aneurysm or vascular malformation. Both vertebral arteries are patent to the basilar. No basilar stenosis. Posterior circulation branch vessels are normal.  IMPRESSION: Normal MRI of the brain and intracranial MR angiography.   Electronically Signed   By: Nelson Chimes M.D.   On: 10/23/2013 10:13   Mr Brain Wo Contrast  10/23/2013   CLINICAL DATA:  Stroke or TIA.  Weakness and blurred vision.  EXAM: MRI HEAD WITHOUT CONTRAST  MRA HEAD WITHOUT CONTRAST  TECHNIQUE: Multiplanar, multiecho pulse sequences of the brain and surrounding structures were obtained without intravenous contrast. Angiographic images of the head were obtained using MRA technique without contrast.  COMPARISON:  Head CT 10/21/2013  FINDINGS: MRI HEAD FINDINGS  Diffusion imaging does not show any acute or subacute infarction. The brainstem and cerebellum are normal. The cerebral hemispheres are normal without evidence of old or acute infarction, mass lesion, hemorrhage, hydrocephalus or extra-axial collection. No pituitary mass. No inflammatory sinus disease. Orbits appear unremarkable. No skull or skullbase lesion.  MRA HEAD FINDINGS  Both internal carotid arteries are widely patent into the brain. The anterior and middle cerebral vessels are patent without proximal stenosis, aneurysm or vascular malformation. Both vertebral arteries are patent to the basilar. No basilar stenosis. Posterior circulation branch vessels are normal.  IMPRESSION: Normal MRI of the brain and intracranial MR angiography.   Electronically Signed   By: Nelson Chimes M.D.   On: 10/23/2013 10:13        Scheduled Meds: . [START ON 10/24/2013] aspirin EC  81 mg Oral Daily  . enoxaparin (LOVENOX) injection  40 mg Subcutaneous Q24H  . FLUoxetine  40 mg Oral Daily  . insulin aspart  0-5 Units Subcutaneous QHS  . insulin aspart  0-9 Units Subcutaneous TID WC  . insulin aspart  3 Units Subcutaneous TID WC  . insulin detemir  14  Units Subcutaneous QHS  . insulin detemir  18 Units Subcutaneous Daily  . levothyroxine  25 mcg Oral QAC breakfast   Continuous Infusions:    Active Problems:   TIA (transient ischemic attack)   Stroke   Type II or unspecified type diabetes mellitus with peripheral circulatory disorders, uncontrolled(250.72)   DKA (diabetic ketoacidoses)   Depression    Time spent:  36 minutes    Devyne Hauger, MD, FACP, FHM. [facility] Pager [phone]  If 7PM-7AM, please contact night-coverage [email or web address] Password [facility] 10/23/2013, 1:57 PM    LOS:  2 days

## 2013-10-23 NOTE — Progress Notes (Signed)
PT Cancellation Note  Patient Details Name: Catherine Travis MRN: [id number] DOB: Aug 25, 1955   Cancelled Treatment:    Reason Eval/Treat Not Completed: PT screened, no needs identified, will sign off Discussed role of acute PT with pt bedside and pt denies any needs at this time.   Jacqulyn Cane 10/23/2013, 12:04 PM Jacqulyn Cane SPT 10/23/2013

## 2013-10-23 NOTE — ED Provider Notes (Signed)
Medical screening examination/treatment/procedure(s) were conducted as a shared visit with non-physician practitioner(s) and myself.  I personally evaluated the patient during the encounter.   EKG Interpretation   Date/Time:  Friday October 21 2013 21:21:03 EDT Ventricular Rate:  88 PR Interval:  162 QRS Duration: 89 QT Interval:  382 QTC Calculation: 462 R Axis:   78 Text Interpretation:  Sinus rhythm Nonspecific T wave abnormality No  previous tracing Confirmed by Lenora Gomes  MD, Lennette Bihari ([id number]) on 10/21/2013  10:14:17 PM      Pt c/o gen weakness, blurry vision, high blood sugars. Pt alert, content, nad. No nv. Ivf. Labs.   Mirna Mires, MD 10/23/13 [phone]

## 2013-10-24 DIAGNOSIS — G459 Transient cerebral ischemic attack, unspecified: Secondary | ICD-10-CM

## 2013-10-24 DIAGNOSIS — I6350 Cerebral infarction due to unspecified occlusion or stenosis of unspecified cerebral artery: Secondary | ICD-10-CM

## 2013-10-24 DIAGNOSIS — E1159 Type 2 diabetes mellitus with other circulatory complications: Secondary | ICD-10-CM

## 2013-10-24 LAB — GLUCOSE, CAPILLARY
GLUCOSE-CAPILLARY: 207 mg/dL — AB (ref 70–99)
GLUCOSE-CAPILLARY: 185 mg/dL — AB (ref 70–99)
GLUCOSE-CAPILLARY: 179 mg/dL — AB (ref 70–99)
Glucose-Capillary: 312 mg/dL — ABNORMAL HIGH (ref 70–99)

## 2013-10-24 MED ORDER — INSULIN ASPART 100 UNIT/ML ~~LOC~~ SOLN: 4.0000 [IU] | Freq: Three times a day (TID) | SUBCUTANEOUS | Status: DC

## 2013-10-24 MED ORDER — INSULIN DETEMIR 100 UNIT/ML ~~LOC~~ SOLN: 18.0000 [IU] | Freq: Every day | SUBCUTANEOUS | Status: AC

## 2013-10-24 MED ORDER — INSULIN DETEMIR 100 UNIT/ML ~~LOC~~ SOLN: 14.0000 [IU] | Freq: Every day | SUBCUTANEOUS | Status: DC

## 2013-10-24 MED ORDER — FLUOXETINE HCL 40 MG PO CAPS: 40.0000 mg | ORAL_CAPSULE | Freq: Every day | ORAL | Status: DC

## 2013-10-24 MED ORDER — ASPIRIN 81 MG PO TBEC: 81.0000 mg | DELAYED_RELEASE_TABLET | Freq: Every day | ORAL | Status: AC

## 2013-10-24 MED ORDER — INSULIN ASPART 100 UNIT/ML ~~LOC~~ SOLN: 0.0000 [IU] | Freq: Three times a day (TID) | SUBCUTANEOUS | Status: AC

## 2013-10-24 NOTE — Discharge Summary (Signed)
Physician Discharge Summary  Catherine Travis XFG:[id number] DOB: October 04, 1955 DOA: 10/21/2013  PCP: No primary provider on file.  Admit date: 10/21/2013 Discharge date: 10/24/2013  Time spent: Greater than 30 minutes  Recommendations for Outpatient Follow-up:  1. PCP in 1 week for evaluation and management of diabetes. 2. May consider outpatient cardiology consultation for evaluation of mild LV hypokinesis reported on 2-D echo.  Discharge Diagnoses:  Active Problems:   TIA (transient ischemic attack)   Stroke   Type II or unspecified type diabetes mellitus with peripheral circulatory disorders, uncontrolled(250.72)   DKA (diabetic ketoacidoses)   Depression   Discharge Condition: Improved & Stable  Diet recommendation: Heart healthy and diabetic diet.  Filed Weights   10/21/13 2114 10/22/13 0145  Weight: 64.864 kg (143 lb) 67.8 kg (149 lb 7.6 oz)    History of present illness:  58 year old female patient with history of DM 2/IDDM with peripheral neuropathy, depression, hypothyroidism, who was admitted on 10/22/13 with frequent "hypoglycemic-like episodes" and facial asymmetry and slurred speech. Her PCP is in [place], [place] and patient moved to the [place] area approximately a year ago but travels back to [place] every 3 months and has not found her PCP here yet. She gives history of brittle diabetes. Has been on current regimen of insulins for approximately 6 months with no recent changes. She is stressed by ongoing divorce proceedings and claims decreased oral intake associated with frequent hypoglycemic episodes over the last few weeks. She does not check her blood sugars regularly but "knows" when she has a hypoglycemic spell. On 10/21/13, she woke up at 4:30 AM with complaints of sweating, feeling like her blood sugars were low then drank a glass of orange juice which she keeps next to her bed. An hour later, she had a similar episode and repeated the same measure. 3 hours  later, she ate some breakfast at [employer] but continued to feel unwell, but blurry vision and noticed left-sided facial asymmetry and her daughter suggested that she had slurred speech while talking on the phone. She returned home and slept for a few hours but still was not feeling right and presented to the ED.  Hospital Course:   1. Uncontrolled DM 2 with peripheral neuropathy and hypoglycemic episodes: Patient gives history of very poorly controlled diabetes and states that her A1c has mostly been in the 11 range. She does not check her CBGs regularly at home. Despite this, she gives herself sliding scale insulin and states that she usually knows the number based on how she feels. Over the last few weeks to months, she has had frequent hypoglycemic episodes which she attributes to poor oral intake related to stress from divorce proceedings. In the hospital, made some adjustments to her Levemir, added mealtime NovoLog along with SSI. On the day of discharge, her CBGs ranged between 179-312. Hemoglobin A1c: 10.5. Diabetes coordinator and this M.D. has counseled patient extensively regarding importance of adequate DM control and she verbalizes understanding. 2. TIA: Stroke workup completed. MRI & MRA brain: Normal. Carotid Dopplers: Bilateral 1-39% ICA stenosis and vertebral artery flow is antegrade. 2-D echo: LVEF 50% and mild hypokinesis (discussed with cardiology on 3/22 and they reviewed her echo which had been read as EF of 45-50% and addended to 50%). Hemoglobin A1c: 10.5. LDL: 67. Change aspirin 81 mg daily for secondary stroke prophylaxis-states that she has had some epistaxis in the past when taking along with Prozac-advised to monitor closely and bring it to the attention of her PCP if she  does have epistaxis. CT head negative.  3. Depression: Patient states that she takes Prozac 40 mg daily 4. Hypothyroidism: Continue Synthroid. TSH normal. 5. 2-D echo findings: May consider outpatient  cardiology consultation for evaluation of mild LV hypokinesis reported on 2-D echo. Patient asymptomatic of chest pain or dyspnea.   Consultations:  None  Procedures:  None    Discharge Exam:  Complaints:  Denies complaints. Anxious to go home.  Filed Vitals:   10/23/13 1330 10/23/13 2129 10/24/13 0220 10/24/13 0608  BP: 138/72 121/71 113/47 113/51  Pulse: 91 76 72 73  Temp: 97.9 F (36.6 C) 98 F (36.7 C) 98.2 F (36.8 C) 97.9 F (36.6 C)  TempSrc: Oral Oral Oral Oral  Resp: 20 16 16 16   Height:      Weight:      SpO2: 98% 96% 94% 96%    General exam: Pleasant middle-aged female lying comfortably in bed.  Respiratory system: Clear. No increased work of breathing.  Cardiovascular system: S1 & S2 heard, RRR. No JVD, murmurs, gallops, clicks or pedal edema. Tele: SR  Gastrointestinal system: Abdomen is nondistended, soft and nontender. Normal bowel sounds heard.  Central nervous system: Alert and oriented. ? Less prominent right nasolabial fold compared to right. No other cranial nerve deficits. No pronator drift.  Extremities: 5 x 5 symmetrical power in all limbs   Discharge Instructions      Discharge Orders   Future Orders Complete By Expires   Call MD for:  As directed    Comments:     Strokelike symptoms or frequent hypoglycemia.   Diet - low sodium heart healthy  As directed    Diet Carb Modified  As directed    Increase activity slowly  As directed        Medication List         aspirin 81 MG EC tablet  Take 1 tablet (81 mg total) by mouth daily.     FLUoxetine 40 MG capsule  Commonly known as:  PROZAC  Take 1 capsule (40 mg total) by mouth daily.     insulin aspart 100 UNIT/ML injection  Commonly known as:  novoLOG  - Inject 0-9 Units into the skin 3 (three) times daily with meals. CBG < 70: Eat or drink something sweet and recheck,   - CBG 70 - 120: 0 units  - CBG 121 - 150: 1 unit  - CBG 151 - 200: 2 units  - CBG 201 - 250: 3  units  - CBG 251 - 300: 5 units  - CBG 301 - 350: 7 units  - CBG 351 - 400: 9 units  - CBG > 400: call MD     insulin aspart 100 UNIT/ML injection  Commonly known as:  novoLOG  Inject 4 Units into the skin 3 (three) times daily with meals.     insulin detemir 100 UNIT/ML injection  Commonly known as:  LEVEMIR  Inject 0.18 mLs (18 Units total) into the skin daily.     insulin detemir 100 UNIT/ML injection  Commonly known as:  LEVEMIR  Inject 0.14 mLs (14 Units total) into the skin at bedtime.     levothyroxine 25 MCG tablet  Commonly known as:  SYNTHROID, LEVOTHROID  Take 25 mcg by mouth daily before breakfast.       Follow-up Information   Follow up with Primary Medical Doctor. Schedule an appointment as soon as possible for a visit in 1 week. (Further assessment and management of  diabetes.)        The results of significant diagnostics from this hospitalization (including imaging, microbiology, ancillary and laboratory) are listed below for reference.    Significant Diagnostic Studies: Dg Chest 2 View  10/22/2013   CLINICAL DATA:  Weakness, stroke  EXAM: CHEST  2 VIEW  COMPARISON:  None.  FINDINGS: The heart size and mediastinal contours are within normal limits. Both lungs are clear. The visualized skeletal structures are unremarkable.  IMPRESSION: No active cardiopulmonary disease.   Electronically Signed   By: Kathreen Devoid   On: 10/22/2013 08:47   Ct Head Wo Contrast  10/21/2013   CLINICAL DATA:  Weakness, blurred lesion  EXAM: CT HEAD WITHOUT CONTRAST  TECHNIQUE: Contiguous axial images were obtained from the base of the skull through the vertex without intravenous contrast.  COMPARISON:  None.  FINDINGS: No acute intracranial hemorrhage. No focal mass lesion. No CT evidence of acute infarction. No midline shift or mass effect. No hydrocephalus. Basilar cisterns are patent.  Paranasal sinuses and mastoid air cells are clear.  IMPRESSION: Normal head CT   Electronically  Signed   By: Suzy Bouchard M.D.   On: 10/21/2013 23:24   Mr Jodene Nam Head Wo Contrast  10/23/2013   CLINICAL DATA:  Stroke or TIA.  Weakness and blurred vision.  EXAM: MRI HEAD WITHOUT CONTRAST  MRA HEAD WITHOUT CONTRAST  TECHNIQUE: Multiplanar, multiecho pulse sequences of the brain and surrounding structures were obtained without intravenous contrast. Angiographic images of the head were obtained using MRA technique without contrast.  COMPARISON:  Head CT 10/21/2013  FINDINGS: MRI HEAD FINDINGS  Diffusion imaging does not show any acute or subacute infarction. The brainstem and cerebellum are normal. The cerebral hemispheres are normal without evidence of old or acute infarction, mass lesion, hemorrhage, hydrocephalus or extra-axial collection. No pituitary mass. No inflammatory sinus disease. Orbits appear unremarkable. No skull or skullbase lesion.  MRA HEAD FINDINGS  Both internal carotid arteries are widely patent into the brain. The anterior and middle cerebral vessels are patent without proximal stenosis, aneurysm or vascular malformation. Both vertebral arteries are patent to the basilar. No basilar stenosis. Posterior circulation branch vessels are normal.  IMPRESSION: Normal MRI of the brain and intracranial MR angiography.   Electronically Signed   By: Nelson Chimes M.D.   On: 10/23/2013 10:13   Mr Brain Wo Contrast  10/23/2013   CLINICAL DATA:  Stroke or TIA.  Weakness and blurred vision.  EXAM: MRI HEAD WITHOUT CONTRAST  MRA HEAD WITHOUT CONTRAST  TECHNIQUE: Multiplanar, multiecho pulse sequences of the brain and surrounding structures were obtained without intravenous contrast. Angiographic images of the head were obtained using MRA technique without contrast.  COMPARISON:  Head CT 10/21/2013  FINDINGS: MRI HEAD FINDINGS  Diffusion imaging does not show any acute or subacute infarction. The brainstem and cerebellum are normal. The cerebral hemispheres are normal without evidence of old or acute  infarction, mass lesion, hemorrhage, hydrocephalus or extra-axial collection. No pituitary mass. No inflammatory sinus disease. Orbits appear unremarkable. No skull or skullbase lesion.  MRA HEAD FINDINGS  Both internal carotid arteries are widely patent into the brain. The anterior and middle cerebral vessels are patent without proximal stenosis, aneurysm or vascular malformation. Both vertebral arteries are patent to the basilar. No basilar stenosis. Posterior circulation branch vessels are normal.  IMPRESSION: Normal MRI of the brain and intracranial MR angiography.   Electronically Signed   By: Nelson Chimes M.D.   On: 10/23/2013 10:13  Microbiology: Recent Results (from the past 240 hour(s))  URINE CULTURE     Status: None   Collection Time    10/22/13  1:14 AM      Result Value Ref Range Status   Specimen Description URINE, RANDOM   Final   Special Requests NONE   Final   Culture  Setup Time     Final   Value: 10/22/2013 05:45     Performed at [place] [facility]     Final   Value: 80,000 COLONIES/ML     Performed at [facility]   Culture     Final   Value: Multiple bacterial morphotypes present, none predominant. Suggest appropriate recollection if clinically indicated.     Performed at [facility]   Report Status 10/23/2013 FINAL   Final     Labs: Basic Metabolic Panel:  Recent Labs Lab 10/21/13 2246  NA 137  K 4.1  CL 95*  CO2 25  GLUCOSE 313*  BUN 12  CREATININE 0.66  CALCIUM 9.7   Liver Function Tests:  Recent Labs Lab 10/21/13 2246  AST 13  ALT 11  ALKPHOS 86  BILITOT <0.2*  PROT 7.8  ALBUMIN 4.0   No results found for this basename: LIPASE, AMYLASE,  in the last 168 hours No results found for this basename: AMMONIA,  in the last 168 hours CBC:  Recent Labs Lab 10/21/13 2246  WBC 7.9  NEUTROABS 5.3  HGB 13.8  HCT 40.8  MCV 94.7  PLT 297   Cardiac Enzymes: No results found for this basename: CKTOTAL, CKMB,  CKMBINDEX, TROPONINI,  in the last 168 hours BNP: BNP (last 3 results) No results found for this basename: PROBNP,  in the last 8760 hours CBG:  Recent Labs Lab 10/23/13 1700 10/23/13 2131 10/24/13 0609 10/24/13 0750 10/24/13 1213  GLUCAP 219* 243* 207* 185* 179*    Additional labs:   Fasting lipids: Cholesterol 141, triglycerides 103, HDL 53, LDL 67 and VLDL 21  TSH: 1.769  Hemoglobin A1c: 10.5  2-D echo 10/23/13: Study Conclusions  - Left ventricle: The cavity size was normal. Wall thickness was normal. Systolic function was probably normal. The estimated ejection fraction was 50%. Mild hypokinesis. - Pericardium, extracardiac: A trivial pericardial effusion was identified.     Signed:  Vernell Leep, MD, FACP, FHM. [facility] Pager [phone]  If 7PM-7AM, please contact night-coverage [email or web address] Password [facility] 10/24/2013, 12:23 PM

## 2013-10-24 NOTE — Progress Notes (Signed)
Inpatient Diabetes Program Recommendations  AACE/ADA: New Consensus Statement on Inpatient Glycemic Control (2013)  Target Ranges:  Prepandial:   less than 140 mg/dL      Peak postprandial:   less than 180 mg/dL (1-2 hours)      Critically ill patients:  140 - 180 mg/dL   Reason for Visit: Request of MD  Note:  Patient feeling hot when I entered room.  Concerned regarding possible low blood glucose.  Had just finished eating lunch.  Staff checked CBG at 312 mg/dl.  Discussed how "feelings" can be deceptive with diabetes.  Only accurate way to determine glycemic control is by checking CBG regularly.  Patient states she realizes that the older she gets, she no longer "knows her body".  States she is willing to test CBG's four times daily.  Uses insulin pens to give insulin.  Doesn't do "prime" before each injection and keeps pen needle on pen all the time.  Instructed her regarding need to prime with 2 units prior to each injection and remove pen-needle after each injection so as not to get air in pen.  States prior A1C was over 11.  Glad it is down to 10.5.  Patient not interested in CHO counting, so did not press issue.  Agreeable to F[facility] for [facility] diabetes education. Will place order for [facility].   Needs a PCP.  Has information regarding the [facility].  Has Medicare as primary and [facility] as secondary insurance.  Searched and found directory of physicians accepting [facility] in the [place] area.  Printed off information and gave it to her.  Encouraged her to tell MD office she is being discharged from the hospital and MD wants her to be seen in a week.  Thank you.  Sanjna Haskew S. Marcelline Mates, RN, CNS, CDE Inpatient Diabetes Program, team pager [phone]

## 2014-03-09 ENCOUNTER — Ambulatory Visit: Payer: Medicare Other | Admitting: Neurology

## 2014-03-22 ENCOUNTER — Encounter: Payer: Self-pay | Admitting: Neurology

## 2014-05-19 ENCOUNTER — Ambulatory Visit: Payer: Medicare Other | Admitting: Internal Medicine

## 2014-05-19 DIAGNOSIS — Z0289 Encounter for other administrative examinations: Secondary | ICD-10-CM

## 2014-08-29 ENCOUNTER — Encounter: Payer: Self-pay | Admitting: Internal Medicine

## 2015-03-05 DIAGNOSIS — F418 Other specified anxiety disorders: Secondary | ICD-10-CM | POA: Diagnosis not present

## 2015-03-05 DIAGNOSIS — Z794 Long term (current) use of insulin: Secondary | ICD-10-CM | POA: Diagnosis not present

## 2015-03-05 DIAGNOSIS — R079 Chest pain, unspecified: Secondary | ICD-10-CM | POA: Diagnosis not present

## 2015-03-05 DIAGNOSIS — E1040 Type 1 diabetes mellitus with diabetic neuropathy, unspecified: Secondary | ICD-10-CM | POA: Diagnosis not present

## 2015-03-19 DIAGNOSIS — E781 Pure hyperglyceridemia: Secondary | ICD-10-CM | POA: Diagnosis not present

## 2015-03-19 DIAGNOSIS — Z6827 Body mass index (BMI) 27.0-27.9, adult: Secondary | ICD-10-CM | POA: Diagnosis not present

## 2015-03-19 DIAGNOSIS — R0602 Shortness of breath: Secondary | ICD-10-CM | POA: Diagnosis not present

## 2015-03-19 DIAGNOSIS — R079 Chest pain, unspecified: Secondary | ICD-10-CM | POA: Diagnosis not present

## 2015-04-13 DIAGNOSIS — R0789 Other chest pain: Secondary | ICD-10-CM | POA: Diagnosis not present

## 2015-04-13 DIAGNOSIS — R0602 Shortness of breath: Secondary | ICD-10-CM | POA: Diagnosis not present

## 2015-05-09 DIAGNOSIS — R0602 Shortness of breath: Secondary | ICD-10-CM | POA: Diagnosis not present

## 2015-05-22 ENCOUNTER — Observation Stay (HOSPITAL_COMMUNITY)
Admission: AD | Admit: 2015-05-22 | Discharge: 2015-05-24 | Disposition: A | Discharge status: Home / Self Care | Payer: Medicare Other | Source: Ambulatory Visit | Attending: Cardiology | Admitting: Cardiology

## 2015-05-22 DIAGNOSIS — IMO0001 Reserved for inherently not codable concepts without codable children: Secondary | ICD-10-CM

## 2015-05-22 DIAGNOSIS — E1042 Type 1 diabetes mellitus with diabetic polyneuropathy: Secondary | ICD-10-CM | POA: Diagnosis not present

## 2015-05-22 DIAGNOSIS — R0789 Other chest pain: Secondary | ICD-10-CM | POA: Diagnosis not present

## 2015-05-22 DIAGNOSIS — E039 Hypothyroidism, unspecified: Secondary | ICD-10-CM | POA: Diagnosis not present

## 2015-05-22 DIAGNOSIS — E1065 Type 1 diabetes mellitus with hyperglycemia: Secondary | ICD-10-CM | POA: Insufficient documentation

## 2015-05-22 DIAGNOSIS — E119 Type 2 diabetes mellitus without complications: Secondary | ICD-10-CM

## 2015-05-22 DIAGNOSIS — E782 Mixed hyperlipidemia: Secondary | ICD-10-CM | POA: Diagnosis not present

## 2015-05-22 DIAGNOSIS — E1040 Type 1 diabetes mellitus with diabetic neuropathy, unspecified: Secondary | ICD-10-CM | POA: Diagnosis not present

## 2015-05-22 DIAGNOSIS — Z8673 Personal history of transient ischemic attack (TIA), and cerebral infarction without residual deficits: Secondary | ICD-10-CM | POA: Insufficient documentation

## 2015-05-22 DIAGNOSIS — Z794 Long term (current) use of insulin: Secondary | ICD-10-CM | POA: Diagnosis not present

## 2015-05-22 DIAGNOSIS — I200 Unstable angina: Secondary | ICD-10-CM

## 2015-05-22 DIAGNOSIS — Z91041 Radiographic dye allergy status: Secondary | ICD-10-CM | POA: Insufficient documentation

## 2015-05-22 DIAGNOSIS — Z87891 Personal history of nicotine dependence: Secondary | ICD-10-CM | POA: Insufficient documentation

## 2015-05-22 DIAGNOSIS — E781 Pure hyperglyceridemia: Secondary | ICD-10-CM | POA: Diagnosis not present

## 2015-05-22 DIAGNOSIS — R0602 Shortness of breath: Secondary | ICD-10-CM | POA: Diagnosis not present

## 2015-05-22 LAB — BASIC METABOLIC PANEL
Anion gap: 12 (ref 5–15)
BUN: 15 mg/dL (ref 6–20)
CHLORIDE: 97 mmol/L — AB (ref 101–111)
CO2: 25 mmol/L (ref 22–32)
Calcium: 9.4 mg/dL (ref 8.9–10.3)
Creatinine, Ser: 0.96 mg/dL (ref 0.44–1.00)
GFR calc Af Amer: >60 mL/min (ref >60)
GLUCOSE: 401 mg/dL — AB (ref 65–99)
POTASSIUM: 4.5 mmol/L (ref 3.5–5.1)
Sodium: 134 mmol/L — ABNORMAL LOW (ref 135–145)

## 2015-05-22 LAB — CBC
HEMATOCRIT: 41.2 % (ref 36.0–46.0)
HEMOGLOBIN: 13.5 g/dL (ref 12.0–15.0)
MCH: 31.8 pg (ref 26.0–34.0)
MCHC: 32.8 g/dL (ref 30.0–36.0)
MCV: 96.9 fL (ref 78.0–100.0)
Platelets: 327 10*3/uL (ref 150–400)
RBC: 4.25 MIL/uL (ref 3.87–5.11)
RDW: 13.6 % (ref 11.5–15.5)
WBC: 9.4 10*3/uL (ref 4.0–10.5)

## 2015-05-22 LAB — TSH: TSH: 0.999 u[IU]/mL (ref 0.350–4.500)

## 2015-05-22 LAB — PROTIME-INR
INR: 1.07 (ref 0.00–1.49)
PROTHROMBIN TIME: 14.1 s (ref 11.6–15.2)

## 2015-05-22 LAB — TROPONIN I

## 2015-05-22 LAB — GLUCOSE, CAPILLARY: GLUCOSE-CAPILLARY: 323 mg/dL — AB (ref 65–99)

## 2015-05-22 MED ORDER — SODIUM CHLORIDE 0.9 % IV SOLN: 250.0000 mL | INTRAVENOUS | Status: DC | PRN

## 2015-05-22 MED ORDER — SODIUM CHLORIDE 0.9 % IJ SOLN: 3.0000 mL | INTRAMUSCULAR | Status: DC | PRN

## 2015-05-22 MED ORDER — SODIUM CHLORIDE 0.9 % IJ SOLN: 3.0000 mL | Freq: Two times a day (BID) | INTRAMUSCULAR | Status: DC

## 2015-05-22 MED ORDER — HEPARIN (PORCINE) IN NACL 100-0.45 UNIT/ML-% IJ SOLN
1000.0000 [IU]/h | INTRAMUSCULAR | Status: DC
Start: 2015-05-22 — End: 2015-05-23
  Administered 2015-05-22: 800 [IU]/h via INTRAVENOUS
  Filled 2015-05-22: qty 250

## 2015-05-22 MED ORDER — LEVOTHYROXINE SODIUM 50 MCG PO TABS
50.0000 ug | ORAL_TABLET | Freq: Every day | ORAL | Status: DC
  Administered 2015-05-23 – 2015-05-24 (×2): 50 ug via ORAL
  Filled 2015-05-22 (×2): qty 1

## 2015-05-22 MED ORDER — HEPARIN BOLUS VIA INFUSION
3000.0000 [IU] | Freq: Once | INTRAVENOUS | Status: AC
  Administered 2015-05-22: 3000 [IU] via INTRAVENOUS
  Filled 2015-05-22: qty 3000

## 2015-05-22 MED ORDER — ONDANSETRON HCL 4 MG/2ML IJ SOLN: 4.0000 mg | Freq: Four times a day (QID) | INTRAMUSCULAR | Status: DC | PRN

## 2015-05-22 MED ORDER — ASPIRIN EC 81 MG PO TBEC
81.0000 mg | DELAYED_RELEASE_TABLET | Freq: Every day | ORAL | Status: DC
  Administered 2015-05-24: 81 mg via ORAL
  Filled 2015-05-22 (×2): qty 1

## 2015-05-22 MED ORDER — INSULIN ASPART 100 UNIT/ML ~~LOC~~ SOLN
0.0000 [IU] | Freq: Three times a day (TID) | SUBCUTANEOUS | Status: DC
  Administered 2015-05-23: 3 [IU] via SUBCUTANEOUS
  Administered 2015-05-23 (×2): 5 [IU] via SUBCUTANEOUS

## 2015-05-22 MED ORDER — NITROGLYCERIN 0.4 MG SL SUBL: 0.4000 mg | SUBLINGUAL_TABLET | SUBLINGUAL | Status: DC | PRN

## 2015-05-22 MED ORDER — CARVEDILOL 3.125 MG PO TABS
3.1250 mg | ORAL_TABLET | Freq: Two times a day (BID) | ORAL | Status: DC
  Administered 2015-05-23 – 2015-05-24 (×3): 3.125 mg via ORAL
  Filled 2015-05-22 (×3): qty 1

## 2015-05-22 MED ORDER — ROSUVASTATIN CALCIUM 20 MG PO TABS
20.0000 mg | ORAL_TABLET | Freq: Every day | ORAL | Status: DC
Start: 2015-05-22 — End: 2015-05-24
  Administered 2015-05-23: 20 mg via ORAL
  Filled 2015-05-22 (×4): qty 1

## 2015-05-22 MED ORDER — PREDNISONE 20 MG PO TABS
50.0000 mg | ORAL_TABLET | ORAL | Status: AC
  Administered 2015-05-23 (×3): 50 mg via ORAL
  Filled 2015-05-22: qty 1
  Filled 2015-05-22: qty 2
  Filled 2015-05-22 (×2): qty 1
  Filled 2015-05-22 (×2): qty 2

## 2015-05-22 MED ORDER — SODIUM CHLORIDE 0.9 % WEIGHT BASED INFUSION
1.0000 mL/kg/h | INTRAVENOUS | Status: DC
  Administered 2015-05-22: 1 mL/kg/h via INTRAVENOUS

## 2015-05-22 MED ORDER — INSULIN DETEMIR 100 UNIT/ML ~~LOC~~ SOLN
14.0000 [IU] | Freq: Every day | SUBCUTANEOUS | Status: DC
  Administered 2015-05-22 – 2015-05-23 (×2): 14 [IU] via SUBCUTANEOUS
  Filled 2015-05-22 (×3): qty 0.14

## 2015-05-22 MED ORDER — NITROGLYCERIN IN D5W 200-5 MCG/ML-% IV SOLN
3.0000 ug/min | INTRAVENOUS | Status: DC
  Administered 2015-05-22: 5 ug/min via INTRAVENOUS
  Filled 2015-05-22: qty 250

## 2015-05-22 MED ORDER — INSULIN DETEMIR 100 UNIT/ML ~~LOC~~ SOLN
18.0000 [IU] | Freq: Every day | SUBCUTANEOUS | Status: DC
  Administered 2015-05-23 – 2015-05-24 (×2): 18 [IU] via SUBCUTANEOUS
  Filled 2015-05-22 (×2): qty 0.18

## 2015-05-22 MED ORDER — ACETAMINOPHEN 325 MG PO TABS: 650.0000 mg | ORAL_TABLET | ORAL | Status: DC | PRN

## 2015-05-22 MED ORDER — HYDROMORPHONE HCL 1 MG/ML IJ SOLN: 0.5000 mg | INTRAMUSCULAR | Status: DC | PRN

## 2015-05-22 MED ORDER — DIPHENHYDRAMINE HCL 25 MG PO CAPS
50.0000 mg | ORAL_CAPSULE | Freq: Once | ORAL | Status: AC
  Administered 2015-05-23: 50 mg via ORAL
  Filled 2015-05-22: qty 2

## 2015-05-22 NOTE — H&P (Signed)
Catherine Travis is an 59 y.o. female.   Chief Complaint: Chest pain, dyspnea on exertion HPI: Catherine Travis  is a 59 y.o. female with a history of chest pain off and on for a few years. Pain is felt in the upper substernal region, she described this as tightness-like feeling.  Chest pain is not related to exertion.  Sometimes, it radiates to the jaw and there is associated shortness of breath.  Occasionally she also had associated sweating with the chest pain.  No history of nausea or vomiting with chest pain.  Patient also complains of shortness of breath off and on, on walking or doing housework etc.  There is no dyspnea at rest and no orthopnea or PND.  She has occasional skipping of the heart but no history of dizziness, near-syncope or syncope.  No history of swelling on the legs and no claudication.  She complains of pain in the legs at other times, She also has pain and numbness in the feet.  The patient attributes leg and feet pain to diabetic peripheral neuropathy and varicose veins.  No history of hypertension.  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  Patient was prescribed Crestor but she never took it.  She does not smoke.  Patient has hypothyroidism.  She had a TIA in April 2015.  Patient felt weak all over, was confused had some difficulty in speech and also drooping of the angle of the face.  The symptoms cleared after 24 hours.  She was scheduled for echo and nuclear stress test and was scheduled for follow up in 2 weeks. She called the office today with reports of episode of chest pain with SOB and diaphoresis this morning. She took a shower and became very dizzy and took 2 NTG and 1 81 mg ASA which relieved symptoms. She has continued to have dyspnea with even the slightest amount of exertion today, including routine household chores.  She had been started on atorvastatin and metoprolol but discontinued these because of  diarrhea and fatigue.  Past Medical History  Diagnosis Date  . Diabetic acidosis, type II   . Cancer     Past Surgical History  Procedure Laterality Date  . Abdominal hysterectomy    . Mouth surgery      Family history: No premature CAD in family. Social History:  reports that she has quit smoking. She does not have any smokeless tobacco history on file. She reports that she drinks alcohol. She reports that she does not use illicit drugs.  Allergies:  Allergies  Allergen Reactions  . Contrast Media [Iodinated Diagnostic Agents] Anaphylaxis    Review of Systems - History obtained from the patient General ROS: positive for  - fatigue negative for - chills or fever Hematological and Lymphatic ROS: negative for - bleeding problems or bruising Respiratory ROS: positive for - shortness of breath negative for - cough, orthopnea or wheezing Cardiovascular ROS: positive for - chest pain, dyspnea on exertion and rapid heart rate negative for - edema or paroxysmal nocturnal dyspnea Neurological ROS: no TIA or stroke symptoms  Blood pressure 149/73, pulse 103, temperature 98.3 F (36.8 C), temperature source Oral, SpO2 100 %.   General appearance: alert, cooperative, appears stated age and no distress Eyes: negative Neck: no adenopathy, no carotid bruit, no JVD, supple, symmetrical, trachea midline and thyroid not enlarged, symmetric, no tenderness/mass/nodules Resp: clear to auscultation bilaterally Chest wall: no tenderness Cardio: regular rate and rhythm, S1, S2 normal, no murmur,  click, rub or gallop GI: soft, non-tender; bowel sounds normal; no masses,  no organomegaly Extremities: extremities normal, atraumatic, no cyanosis or edema Pulses: 2+ and symmetric Skin: Skin color, texture, turgor normal. No rashes or lesions Neurologic: Grossly normal  Labs:   Lab Results  Component Value Date   WBC 7.9 10/21/2013   HGB 13.8 10/21/2013   HCT 40.8 10/21/2013   MCV 94.7  10/21/2013   PLT 297 10/21/2013   No results for input(s): NA, K, CL, CO2, BUN, CREATININE, CALCIUM, PROT, BILITOT, ALKPHOS, ALT, AST, GLUCOSE in the last 168 hours.  Invalid input(s): LABALBU  Lipid Panel     Component Value Date/Time   CHOL 141 10/22/2013 0848   TRIG 103 10/22/2013 0848   HDL 53 10/22/2013 0848   CHOLHDL 2.7 10/22/2013 0848   VLDL 21 10/22/2013 0848   LDLCALC 67 10/22/2013 0848    HEMOGLOBIN A1C Lab Results  Component Value Date   HGBA1C 10.5* 10/22/2013   MPG 255* 10/22/2013    EKG: Office EKG 05/22/2015 no change from hospital EKG 10/21/2013: NSR, early repolarization in inferior leads with ST elevation without reciprocal changes.  Outpatient Lexiscan myoview stress test 04/14/2015: 1. The resting electrocardiogram demonstrated normal sinus rhythm, normal resting conduction, no resting arrhythmias and normal rest repolarization.  Stress EKG is non-diagnostic for ischemia as it a pharmacologic stress using Lexiscan. Stress symptoms included dyspnea and chest pressure. The stress test was terminated because of the end of the pharmacological testing. 2. Myocardial perfusion imaging is normal. Overall left ventricular systolic function was normal without regional wall motion abnormalities. The left ventricular ejection fraction was 77%.  Echocardiogram 05/09/2015: 1. Left ventricle cavity is normal in size. Normal global wall motion. Doppler evidence of grade I (impaired) diastolic dysfunction. Calculated EF 67%. 2. Color Doppler across inter atrial septum is suggestive of small PFO. 3. Trace mitral regurgitation. 4. Trace tricuspid regurgitation.  No prescriptions prior to admission    No current facility-administered medications for this encounter.  Current outpatient prescriptions:  .  aspirin EC 81 MG EC tablet, Take 1 tablet (81 mg total) by mouth daily., Disp: 30 tablet, Rfl: 0 .  FLUoxetine (PROZAC) 40 MG capsule, Take 1 capsule (40 mg total) by mouth  daily., Disp: , Rfl:  .  insulin aspart (NOVOLOG) 100 UNIT/ML injection, Inject 0-9 Units into the skin 3 (three) times daily with meals. CBG < 70: Eat or drink something sweet and recheck,  CBG 70 - 120: 0 units CBG 121 - 150: 1 unit CBG 151 - 200: 2 units CBG 201 - 250: 3 units CBG 251 - 300: 5 units CBG 301 - 350: 7 units CBG 351 - 400: 9 units CBG > 400: call MD, Disp: , Rfl:  .  insulin aspart (NOVOLOG) 100 UNIT/ML injection, Inject 4 Units into the skin 3 (three) times daily with meals., Disp: 10 mL, Rfl: 0 .  insulin detemir (LEVEMIR) 100 UNIT/ML injection, Inject 0.18 mLs (18 Units total) into the skin daily., Disp: , Rfl:  .  insulin detemir (LEVEMIR) 100 UNIT/ML injection, Inject 0.14 mLs (14 Units total) into the skin at bedtime., Disp: 10 mL, Rfl: 0 .  levothyroxine (SYNTHROID, LEVOTHROID) 25 MCG tablet, Take 25 mcg by mouth daily before breakfast., Disp: , Rfl:    Assessment/Plan 1. Unstable Angina 2. Uncontrolled Type 1 DM 3. Hyperlipidemia 4. H/O TIA in April 2015.  Patient felt weak all over, was confused had some difficulty in speech and also drooping of the  angle of the face. 5. Contrast allergy: Anaphylaxis.  Recommendation: Symptoms very concerning for unstable angina in a patient with significant risk factors including uncontrolled diabetes and untreated hyperlipidemia.  Schedule for cardiac catheterization, and possible angioplasty, tomorrow. We discussed regarding risks, benefits, alternatives to this including stress testing, CTA and continued medical therapy. Patient agrees to proceed. Understands <1-2% risk of death, stroke, MI, urgent CABG, bleeding, infection, renal failure but not limited to these.   Patient on her last OV prescribed Atorvastatin and Metoprolol, which she has discontinued stating GI side effects. Will Try Crestor and Coreg.  Rachel Bo, NP-C 05/22/2015, 5:29 PM [facility] Pager: [phone] Office: [phone]  I  have personally reviewed the patient's record and performed physical exam and agree with the assessment and plan of Ms. Neldon Labella, NP-C.  Adrian Prows, MD 05/22/2015, 6:42 PM [facility] [place] Pager: [phone] Office: [phone] If no answer: Cell:  [phone]

## 2015-05-22 NOTE — Progress Notes (Signed)
ANTICOAGULATION CONSULT NOTE - Initial Consult  Pharmacy Consult for Heparin Indication: chest pain/ACS  Allergies  Allergen Reactions  . Contrast Media [Iodinated Diagnostic Agents] Anaphylaxis    Patient Measurements: TBW 68 kg as of last year IBW 57 kg  Vital Signs:    Labs: No results for input(s): HGB, HCT, PLT, APTT, LABPROT, INR, HEPARINUNFRC, CREATININE, CKTOTAL, CKMB, TROPONINI in the last 72 hours.  CrCl cannot be calculated (Unknown ideal weight.).   Medical History: Past Medical History  Diagnosis Date  . Diabetic acidosis, type II   . Cancer     Assessment: 59 yo F presents on 10/18 with chest pain and dyspnea on exertion. Not on any anticoag PTA. Scheduled for cath tomorrow with Dr. Einar Gip at 1600. Pharmacy consulted to start heparin tonight. Labs ordered. No s/s of bleed. No history of kidney disease  Goal of Therapy:  Heparin level 0.3-0.7 units/ml Monitor platelets by anticoagulation protocol: Yes   Plan:  Give heparin 3,000 units BOLUS Start heparin gtt at 800 units/hr Check 6 hr HL Monitor daily HL, CBC, s/s of bleed F/U baseline labs with first HL  Elenor Quinones, PharmD Clinical Pharmacist Pager [phone] 05/22/2015 6:20 PM

## 2015-05-23 ENCOUNTER — Encounter (HOSPITAL_COMMUNITY)
Admission: AD | Disposition: A | Discharge status: Home / Self Care | Payer: Self-pay | Source: Ambulatory Visit | Attending: Cardiology

## 2015-05-23 ENCOUNTER — Ambulatory Visit (HOSPITAL_COMMUNITY): Admission: RE | Admit: 2015-05-23 | Payer: Medicare Other | Source: Ambulatory Visit | Admitting: Cardiology

## 2015-05-23 ENCOUNTER — Encounter (HOSPITAL_COMMUNITY): Payer: Self-pay | Admitting: *Deleted

## 2015-05-23 DIAGNOSIS — R0609 Other forms of dyspnea: Secondary | ICD-10-CM | POA: Diagnosis not present

## 2015-05-23 DIAGNOSIS — E1040 Type 1 diabetes mellitus with diabetic neuropathy, unspecified: Secondary | ICD-10-CM | POA: Diagnosis not present

## 2015-05-23 DIAGNOSIS — Z794 Long term (current) use of insulin: Secondary | ICD-10-CM | POA: Diagnosis not present

## 2015-05-23 DIAGNOSIS — R0789 Other chest pain: Secondary | ICD-10-CM | POA: Diagnosis not present

## 2015-05-23 DIAGNOSIS — E782 Mixed hyperlipidemia: Secondary | ICD-10-CM | POA: Diagnosis not present

## 2015-05-23 DIAGNOSIS — E039 Hypothyroidism, unspecified: Secondary | ICD-10-CM | POA: Diagnosis not present

## 2015-05-23 DIAGNOSIS — Z8673 Personal history of transient ischemic attack (TIA), and cerebral infarction without residual deficits: Secondary | ICD-10-CM | POA: Diagnosis not present

## 2015-05-23 DIAGNOSIS — Z91041 Radiographic dye allergy status: Secondary | ICD-10-CM | POA: Diagnosis not present

## 2015-05-23 DIAGNOSIS — E1065 Type 1 diabetes mellitus with hyperglycemia: Secondary | ICD-10-CM | POA: Diagnosis not present

## 2015-05-23 DIAGNOSIS — Z87891 Personal history of nicotine dependence: Secondary | ICD-10-CM | POA: Diagnosis not present

## 2015-05-23 DIAGNOSIS — R079 Chest pain, unspecified: Secondary | ICD-10-CM | POA: Diagnosis not present

## 2015-05-23 HISTORY — PX: PERIPHERAL VASCULAR CATHETERIZATION: SHX172C

## 2015-05-23 HISTORY — PX: CARDIAC CATHETERIZATION: SHX172

## 2015-05-23 LAB — CBC
HCT: 39 % (ref 36.0–46.0)
Hemoglobin: 12.9 g/dL (ref 12.0–15.0)
MCH: 31.9 pg (ref 26.0–34.0)
MCHC: 33.1 g/dL (ref 30.0–36.0)
MCV: 96.5 fL (ref 78.0–100.0)
PLATELETS: 263 10*3/uL (ref 150–400)
RBC: 4.04 MIL/uL (ref 3.87–5.11)
RDW: 13.5 % (ref 11.5–15.5)
WBC: 11.3 10*3/uL — AB (ref 4.0–10.5)

## 2015-05-23 LAB — HEMOGLOBIN A1C
HEMOGLOBIN A1C: 7.8 % — AB (ref 4.8–5.6)
MEAN PLASMA GLUCOSE: 177 mg/dL

## 2015-05-23 LAB — GLUCOSE, CAPILLARY
GLUCOSE-CAPILLARY: 302 mg/dL — AB (ref 65–99)
GLUCOSE-CAPILLARY: 274 mg/dL — AB (ref 65–99)
GLUCOSE-CAPILLARY: 229 mg/dL — AB (ref 65–99)
Glucose-Capillary: 238 mg/dL — ABNORMAL HIGH (ref 65–99)

## 2015-05-23 LAB — TROPONIN I: Troponin I: <0.03 ng/mL (ref <0.031)

## 2015-05-23 LAB — HEPARIN LEVEL (UNFRACTIONATED)
HEPARIN UNFRACTIONATED: 0.39 [IU]/mL (ref 0.30–0.70)
HEPARIN UNFRACTIONATED: 0.27 [IU]/mL — AB (ref 0.30–0.70)

## 2015-05-23 SURGERY — LEFT HEART CATH AND CORONARY ANGIOGRAPHY
Anesthesia: LOCAL

## 2015-05-23 MED ORDER — SODIUM CHLORIDE 0.9 % WEIGHT BASED INFUSION
1.0000 mL/kg/h | INTRAVENOUS | Status: DC
  Administered 2015-05-23: 250 mL via INTRAVENOUS

## 2015-05-23 MED ORDER — SODIUM CHLORIDE 0.9 % IJ SOLN: 3.0000 mL | INTRAMUSCULAR | Status: DC | PRN

## 2015-05-23 MED ORDER — MIDAZOLAM HCL 2 MG/2ML IJ SOLN
INTRAMUSCULAR | Status: AC
  Filled 2015-05-23: qty 4

## 2015-05-23 MED ORDER — HEPARIN (PORCINE) IN NACL 2-0.9 UNIT/ML-% IJ SOLN
INTRAMUSCULAR | Status: AC
  Filled 2015-05-23: qty 1000

## 2015-05-23 MED ORDER — HEPARIN SODIUM (PORCINE) 1000 UNIT/ML IJ SOLN
INTRAMUSCULAR | Status: DC | PRN
  Administered 2015-05-23: 5000 [IU] via INTRAVENOUS

## 2015-05-23 MED ORDER — METHYLPREDNISOLONE SODIUM SUCC 125 MG IJ SOLR
125.0000 mg | Freq: Once | INTRAMUSCULAR | Status: AC
  Administered 2015-05-23: 125 mg via INTRAVENOUS

## 2015-05-23 MED ORDER — HEPARIN SODIUM (PORCINE) 1000 UNIT/ML IJ SOLN
INTRAMUSCULAR | Status: AC
  Filled 2015-05-23: qty 1

## 2015-05-23 MED ORDER — HYDROMORPHONE HCL 1 MG/ML IJ SOLN
INTRAMUSCULAR | Status: AC
  Filled 2015-05-23: qty 1

## 2015-05-23 MED ORDER — SODIUM CHLORIDE 0.9 % IV SOLN: 250.0000 mL | INTRAVENOUS | Status: DC | PRN

## 2015-05-23 MED ORDER — LIDOCAINE HCL (PF) 1 % IJ SOLN
INTRAMUSCULAR | Status: AC
  Filled 2015-05-23: qty 30

## 2015-05-23 MED ORDER — SODIUM CHLORIDE 0.9 % IJ SOLN
3.0000 mL | Freq: Two times a day (BID) | INTRAMUSCULAR | Status: DC
  Administered 2015-05-23 – 2015-05-24 (×2): 3 mL via INTRAVENOUS

## 2015-05-23 MED ORDER — LIDOCAINE HCL (PF) 1 % IJ SOLN
INTRAMUSCULAR | Status: DC | PRN
  Administered 2015-05-23: 17:00:00

## 2015-05-23 MED ORDER — DIPHENHYDRAMINE HCL 50 MG/ML IJ SOLN
INTRAMUSCULAR | Status: DC | PRN
  Administered 2015-05-23: 25 mg via INTRAVENOUS

## 2015-05-23 MED ORDER — SODIUM CHLORIDE 0.9 % IJ SOLN
3.0000 mL | Freq: Two times a day (BID) | INTRAMUSCULAR | Status: DC
  Administered 2015-05-24: 3 mL via INTRAVENOUS

## 2015-05-23 MED ORDER — METHYLPREDNISOLONE SODIUM SUCC 125 MG IJ SOLR
INTRAMUSCULAR | Status: AC
  Filled 2015-05-23: qty 2

## 2015-05-23 MED ORDER — ASPIRIN 81 MG PO CHEW
81.0000 mg | CHEWABLE_TABLET | ORAL | Status: AC
  Administered 2015-05-23: 81 mg via ORAL
  Filled 2015-05-23: qty 1

## 2015-05-23 MED ORDER — IOHEXOL 350 MG/ML SOLN
INTRAVENOUS | Status: DC | PRN
  Administered 2015-05-23: 70 mL via INTRAVENOUS

## 2015-05-23 MED ORDER — FAMOTIDINE IN NACL 20-0.9 MG/50ML-% IV SOLN
INTRAVENOUS | Status: AC
  Filled 2015-05-23: qty 50

## 2015-05-23 MED ORDER — NITROGLYCERIN 1 MG/10 ML FOR IR/CATH LAB
INTRA_ARTERIAL | Status: AC
  Filled 2015-05-23: qty 10

## 2015-05-23 MED ORDER — MIDAZOLAM HCL 2 MG/2ML IJ SOLN
INTRAMUSCULAR | Status: DC | PRN
  Administered 2015-05-23: 2 mg via INTRAVENOUS
  Administered 2015-05-23: 1 mg via INTRAVENOUS

## 2015-05-23 MED ORDER — FAMOTIDINE IN NACL 20-0.9 MG/50ML-% IV SOLN
INTRAVENOUS | Status: DC | PRN
  Administered 2015-05-23: 20 mg via INTRAVENOUS

## 2015-05-23 MED ORDER — VERAPAMIL HCL 2.5 MG/ML IV SOLN
INTRAVENOUS | Status: AC
  Filled 2015-05-23: qty 2

## 2015-05-23 MED ORDER — DIPHENHYDRAMINE HCL 50 MG/ML IJ SOLN
INTRAMUSCULAR | Status: AC
  Filled 2015-05-23: qty 1

## 2015-05-23 MED ORDER — SODIUM CHLORIDE 0.9 % WEIGHT BASED INFUSION
3.0000 mL/kg/h | INTRAVENOUS | Status: AC
Start: 2015-05-23 — End: 2015-05-23
  Administered 2015-05-23: 3 mL/kg/h via INTRAVENOUS

## 2015-05-23 MED ORDER — HYDROCORTISONE NA SUCCINATE PF 100 MG IJ SOLR
100.0000 mg | Freq: Once | INTRAMUSCULAR | Status: AC
  Administered 2015-05-23: 100 mg via INTRAVENOUS
  Filled 2015-05-23: qty 2

## 2015-05-23 MED ORDER — FAMOTIDINE IN NACL 20-0.9 MG/50ML-% IV SOLN
20.0000 mg | INTRAVENOUS | Status: AC
  Filled 2015-05-23: qty 50

## 2015-05-23 MED ORDER — VERAPAMIL HCL 2.5 MG/ML IV SOLN
INTRA_ARTERIAL | Status: DC | PRN
  Administered 2015-05-23: 15 mL via INTRA_ARTERIAL

## 2015-05-23 MED ORDER — HYDROMORPHONE HCL 1 MG/ML IJ SOLN
INTRAMUSCULAR | Status: DC | PRN
  Administered 2015-05-23 (×2): 0.5 mg via INTRAVENOUS

## 2015-05-23 MED ORDER — SODIUM CHLORIDE 0.9 % WEIGHT BASED INFUSION: 3.0000 mL/kg/h | INTRAVENOUS | Status: AC

## 2015-05-23 SURGICAL SUPPLY — 11 items
CATH INFINITI 5FR ANG PIGTAIL (CATHETERS) ×3 IMPLANT
CATH OPTITORQUE TIG 4.0 5F (CATHETERS) ×3 IMPLANT
DEVICE RAD COMP TR BAND LRG (VASCULAR PRODUCTS) ×3 IMPLANT
GLIDESHEATH SLEND A-KIT 6F 20G (SHEATH) ×3 IMPLANT
KIT HEART LEFT (KITS) ×3 IMPLANT
PACK CARDIAC CATHETERIZATION (CUSTOM PROCEDURE TRAY) ×3 IMPLANT
SYR MEDRAD MARK V 150ML (SYRINGE) ×3 IMPLANT
TRANSDUCER W/STOPCOCK (MISCELLANEOUS) ×3 IMPLANT
TUBING CIL FLEX 10 FLL-RA (TUBING) ×3 IMPLANT
WIRE HI TORQ VERSACORE-J 145CM (WIRE) ×3 IMPLANT
WIRE SAFE-T 1.5MM-J .035X260CM (WIRE) ×3 IMPLANT

## 2015-05-23 NOTE — Interval H&P Note (Signed)
Cath Lab Visit (complete for each Cath Lab visit)  Clinical Evaluation Leading to the Procedure:   ACS: Yes.    Non-ACS:    Anginal Classification: CCS IV  Anti-ischemic medical therapy: No Therapy  Non-Invasive Test Results: No non-invasive testing performed  Prior CABG: No previous CABG      History and Physical Interval Note:  05/23/2015 4:06 PM  Catherine Travis  has presented today for surgery, with the diagnosis of cp  The various methods of treatment have been discussed with the patient and family. After consideration of risks, benefits and other options for treatment, the patient has consented to  Procedure(s): Left Heart Cath and Coronary Angiography (N/A) as a surgical intervention .  The patient's history has been reviewed, patient examined, no change in status, stable for surgery.  I have reviewed the patient's chart and labs.  Questions were answered to the patient's satisfaction.     Adrian Prows

## 2015-05-23 NOTE — Progress Notes (Signed)
[place] for Heparin Indication: chest pain/ACS  Allergies  Allergen Reactions  . Contrast Media [Iodinated Diagnostic Agents] Anaphylaxis    Patient Measurements: TBW 68 kg as of last year IBW 57 kg  Vital Signs: Temp: 98 F (36.7 C) (10/18 1953) Temp Source: Oral (10/18 1953) BP: 117/57 mmHg (10/18 1953) Pulse Rate: 91 (10/18 1953)  Labs:  Recent Labs  05/22/15 1847 05/23/15 0049  HGB 13.5  --   HCT 41.2  --   PLT 327  --   LABPROT 14.1  --   INR 1.07  --   HEPARINUNFRC  --  0.27*  CREATININE 0.96  --   TROPONINI <0.03  --     Estimated Creatinine Clearance: 59.8 mL/min (by C-G formula based on Cr of 0.96).   Medical History: Past Medical History  Diagnosis Date  . Diabetic acidosis, type II   . Cancer     Assessment: 59 y.o. female with chest pain for heparin   Goal of Therapy:  Heparin level 0.3-0.7 units/ml Monitor platelets by anticoagulation protocol: Yes   Plan:  Increase Heparin 1000 units/hr Check heparin level in 6 hours.   Phillis Knack, PharmD, BCPS  05/23/2015 1:33 AM

## 2015-05-23 NOTE — Progress Notes (Signed)
Inpatient Diabetes Program Recommendations  AACE/ADA: New Consensus Statement on Inpatient Glycemic Control (2015)  Target Ranges:  Prepandial:   less than 140 mg/dL      Peak postprandial:   less than 180 mg/dL (1-2 hours)      Critically ill patients:  140 - 180 mg/dL   Review of Glycemic Control  Diabetes history: DM 2 Outpatient Diabetes medications: Levemir 18 units QAM, 14 units QPM, Novolog Sensitive  Current orders for Inpatient glycemic control: Levemir 18 units QAM, 14 units QPM, Novolog Sensitive TID  Inpatient Diabetes Program Recommendations: Correction (SSI): Consider adjusting correction to Novolog Resistant Q4hrs while glucose is elevated and patient is taking steroids.   Thanks, Tama Headings RN, MSN, [facility] Inpatient Diabetes Coordinator Team Pager [phone] (8a-5p)

## 2015-05-23 NOTE — Progress Notes (Signed)
Spoke with Neldon Labella, NP and confirmed that it was safe to give scheduled dose of Levemir at this time as patient's blood sugar was 302 at 0614.

## 2015-05-23 NOTE — Progress Notes (Signed)
[place] for Heparin Indication: chest pain/ACS  Allergies  Allergen Reactions  . Contrast Media [Iodinated Diagnostic Agents] Anaphylaxis    Patient Measurements: TBW 68 kg as of last year IBW 57 kg  Vital Signs: Temp: 98.3 F (36.8 C) (10/19 0442) Temp Source: Oral (10/19 0442) BP: 126/67 mmHg (10/19 0812) Pulse Rate: 98 (10/19 0812)  Labs:  Recent Labs  05/22/15 1847 05/23/15 0049 05/23/15 0745 05/23/15 0910  HGB 13.5  --  12.9  --   HCT 41.2  --  39.0  --   PLT 327  --  263  --   LABPROT 14.1  --   --   --   INR 1.07  --   --   --   HEPARINUNFRC  --  0.27*  --  0.39  CREATININE 0.96  --   --   --   TROPONINI <0.03 <0.03 <0.03  --     Estimated Creatinine Clearance: 59.8 mL/min (by C-G formula based on Cr of 0.96).   Medical History: Past Medical History  Diagnosis Date  . Diabetic acidosis, type II   . Cancer     Assessment: 59 y.o. female with chest pain for heparin.  Heparin now at goal this am (0.39). No bleeding issues noted, cbc stable. For cath this afternoon.  Goal of Therapy:  Heparin level 0.3-0.7 units/ml Monitor platelets by anticoagulation protocol: Yes   Plan:  Continue Heparin at 1000 units/hr Follow up after cath  Erin Hearing PharmD., BCPS Clinical Pharmacist Pager [phone] 05/23/2015 10:48 AM

## 2015-05-23 NOTE — Progress Notes (Signed)
Pt arrived in cath holding in NAD but was hoarse and said it was difficult to swallow. When symptoms became slightly worse Dr Nadyne Coombes was paged ,gave verbal order for solu-cortef and was quickly at bed side.Pt was soon resting comfortably and improving. Will observe and return pt to 2w06 when symptoms improve a little more.

## 2015-05-23 NOTE — Progress Notes (Signed)
Pt returned from procedure. Report received from RN. TR band at 5 - removed 3 cc of air. Pt on 2L, no SOB, sleepy. Pt resting and comfortable. Will continue to monitor.   Fritz Pickerel, RN

## 2015-05-23 NOTE — Progress Notes (Signed)
Subjective:  Pt denies any chest pain presently. NTG and heparin gtt infusing. No new symptoms or concerns today.  Objective:  Vital Signs in the last 24 hours: Temp:  [98 F (36.7 C)-98.3 F (36.8 C)] 98.3 F (36.8 C) (10/19 0442) Pulse Rate:  [91-103] 98 (10/19 0812) Resp:  [17] 17 (10/19 0442) BP: (117-149)/(57-73) 126/67 mmHg (10/19 0812) SpO2:  [100 %] 100 % (10/19 0442) Weight:  [67.8 kg (149 lb 7.6 oz)] 67.8 kg (149 lb 7.6 oz) (10/18 1939)  Intake/Output from previous day:    Physical Exam: General appearance: alert, cooperative, appears stated age and no distress Eyes: negative Neck: no adenopathy, no carotid bruit, no JVD, supple, symmetrical, trachea midline and thyroid not enlarged, symmetric, no tenderness/mass/nodules Resp: clear to auscultation bilaterally Chest wall: no tenderness Cardio: regular rate and rhythm, S1, S2 normal, no murmur, click, rub or gallop GI: soft, non-tender; bowel sounds normal; no masses, no organomegaly Extremities: extremities normal, atraumatic, no cyanosis or edema Pulses: 2+ and symmetric Skin: Skin color, texture, turgor normal. No rashes or lesions Neurologic: Grossly normal   Lab Results: BMP  Recent Labs  05/22/15 1847  NA 134*  K 4.5  CL 97*  CO2 25  GLUCOSE 401*  BUN 15  CREATININE 0.96  CALCIUM 9.4  GFRNONAA >60  GFRAA >60    CBC  Recent Labs Lab 05/23/15 0745  WBC 11.3*  RBC 4.04  HGB 12.9  HCT 39.0  PLT 263  MCV 96.5  MCH 31.9  MCHC 33.1  RDW 13.5    HEMOGLOBIN A1C Lab Results  Component Value Date   HGBA1C 7.8* 05/22/2015   MPG 177 05/22/2015    Cardiac Panel (last 3 results)  Recent Labs  05/22/15 1847 05/23/15 0049 05/23/15 0745  TROPONINI <0.03 <0.03 <0.03    BNP (last 3 results) No results for input(s): PROBNP in the last 8760 hours.  TSH  Recent Labs  05/22/15 1847  TSH 0.999    CHOLESTEROL No results for input(s): CHOL in the last 8760 hours.  Hepatic Function  Panel No results for input(s): PROT, ALBUMIN, AST, ALT, ALKPHOS, BILITOT, BILIDIR, IBILI in the last 8760 hours.  Imaging: No results found.  Cardiac Studies:  Office EKG 05/22/2015 no change from hospital EKG 10/21/2013: NSR, early repolarization in inferior leads with ST elevation without reciprocal changes..   Assessment/Plan:  1. Unstable Angina 2. Uncontrolled Type 1 DM 3. Hyperlipidemia 4. H/O TIA in April 2015. Patient felt weak all over, was confused had some difficulty in speech and also drooping of the angle of the face. 5. Contrast allergy: Anaphylaxis.  Rec: Pt now NPO, proceed with cardiac catheterization as scheduled this afternoon.  Rachel Bo, NP-C 05/23/2015, 8:48 AM [facility] Pager: [phone] Office: [phone]

## 2015-05-23 NOTE — H&P (View-Only) (Signed)
Subjective:  Pt denies any chest pain presently. NTG and heparin gtt infusing. No new symptoms or concerns today.  Objective:  Vital Signs in the last 24 hours: Temp:  [98 F (36.7 C)-98.3 F (36.8 C)] 98.3 F (36.8 C) (10/19 0442) Pulse Rate:  [91-103] 98 (10/19 0812) Resp:  [17] 17 (10/19 0442) BP: (117-149)/(57-73) 126/67 mmHg (10/19 0812) SpO2:  [100 %] 100 % (10/19 0442) Weight:  [67.8 kg (149 lb 7.6 oz)] 67.8 kg (149 lb 7.6 oz) (10/18 1939)  Intake/Output from previous day:    Physical Exam: General appearance: alert, cooperative, appears stated age and no distress Eyes: negative Neck: no adenopathy, no carotid bruit, no JVD, supple, symmetrical, trachea midline and thyroid not enlarged, symmetric, no tenderness/mass/nodules Resp: clear to auscultation bilaterally Chest wall: no tenderness Cardio: regular rate and rhythm, S1, S2 normal, no murmur, click, rub or gallop GI: soft, non-tender; bowel sounds normal; no masses, no organomegaly Extremities: extremities normal, atraumatic, no cyanosis or edema Pulses: 2+ and symmetric Skin: Skin color, texture, turgor normal. No rashes or lesions Neurologic: Grossly normal   Lab Results: BMP  Recent Labs  05/22/15 1847  NA 134*  K 4.5  CL 97*  CO2 25  GLUCOSE 401*  BUN 15  CREATININE 0.96  CALCIUM 9.4  GFRNONAA >60  GFRAA >60    CBC  Recent Labs Lab 05/23/15 0745  WBC 11.3*  RBC 4.04  HGB 12.9  HCT 39.0  PLT 263  MCV 96.5  MCH 31.9  MCHC 33.1  RDW 13.5    HEMOGLOBIN A1C Lab Results  Component Value Date   HGBA1C 7.8* 05/22/2015   MPG 177 05/22/2015    Cardiac Panel (last 3 results)  Recent Labs  05/22/15 1847 05/23/15 0049 05/23/15 0745  TROPONINI <0.03 <0.03 <0.03    BNP (last 3 results) No results for input(s): PROBNP in the last 8760 hours.  TSH  Recent Labs  05/22/15 1847  TSH 0.999    CHOLESTEROL No results for input(s): CHOL in the last 8760 hours.  Hepatic Function  Panel No results for input(s): PROT, ALBUMIN, AST, ALT, ALKPHOS, BILITOT, BILIDIR, IBILI in the last 8760 hours.  Imaging: No results found.  Cardiac Studies:  Office EKG 05/22/2015 no change from hospital EKG 10/21/2013: NSR, early repolarization in inferior leads with ST elevation without reciprocal changes..   Assessment/Plan:  1. Unstable Angina 2. Uncontrolled Type 1 DM 3. Hyperlipidemia 4. H/O TIA in April 2015. Patient felt weak all over, was confused had some difficulty in speech and also drooping of the angle of the face. 5. Contrast allergy: Anaphylaxis.  Rec: Pt now NPO, proceed with cardiac catheterization as scheduled this afternoon.  Rachel Bo, NP-C 05/23/2015, 8:48 AM [facility] Pager: [phone] Office: [phone]

## 2015-05-24 ENCOUNTER — Encounter (HOSPITAL_COMMUNITY): Payer: Self-pay | Admitting: Cardiology

## 2015-05-24 DIAGNOSIS — R0789 Other chest pain: Secondary | ICD-10-CM | POA: Diagnosis not present

## 2015-05-24 DIAGNOSIS — E782 Mixed hyperlipidemia: Secondary | ICD-10-CM | POA: Diagnosis not present

## 2015-05-24 DIAGNOSIS — Z87891 Personal history of nicotine dependence: Secondary | ICD-10-CM | POA: Diagnosis not present

## 2015-05-24 DIAGNOSIS — E039 Hypothyroidism, unspecified: Secondary | ICD-10-CM | POA: Diagnosis not present

## 2015-05-24 DIAGNOSIS — E1040 Type 1 diabetes mellitus with diabetic neuropathy, unspecified: Secondary | ICD-10-CM | POA: Diagnosis not present

## 2015-05-24 DIAGNOSIS — Z8673 Personal history of transient ischemic attack (TIA), and cerebral infarction without residual deficits: Secondary | ICD-10-CM | POA: Diagnosis not present

## 2015-05-24 DIAGNOSIS — Z794 Long term (current) use of insulin: Secondary | ICD-10-CM | POA: Diagnosis not present

## 2015-05-24 DIAGNOSIS — E1065 Type 1 diabetes mellitus with hyperglycemia: Secondary | ICD-10-CM | POA: Diagnosis not present

## 2015-05-24 DIAGNOSIS — Z91041 Radiographic dye allergy status: Secondary | ICD-10-CM | POA: Diagnosis not present

## 2015-05-24 LAB — GLUCOSE, CAPILLARY
GLUCOSE-CAPILLARY: 427 mg/dL — AB (ref 65–99)
GLUCOSE-CAPILLARY: 430 mg/dL — AB (ref 65–99)
Glucose-Capillary: 473 mg/dL — ABNORMAL HIGH (ref 65–99)

## 2015-05-24 MED ORDER — ROSUVASTATIN CALCIUM 20 MG PO TABS: 20.0000 mg | ORAL_TABLET | Freq: Every day | ORAL | Status: AC

## 2015-05-24 MED ORDER — INSULIN ASPART 100 UNIT/ML ~~LOC~~ SOLN
12.0000 [IU] | Freq: Once | SUBCUTANEOUS | Status: AC
  Administered 2015-05-24: 12 [IU] via SUBCUTANEOUS

## 2015-05-24 NOTE — Discharge Summary (Signed)
Physician Discharge Summary  Patient ID: Catherine Travis MRN: [id number] DOB/AGE: [birth year] 59 y.o.  Admit date: 05/22/2015 Discharge date: 05/24/2015  Primary Discharge Diagnosis: Chest pain  Secondary Discharge Diagnosis: 1. Uncontrolled Type 1 Diabetes 2. Hyperlipidemia, mixed  Significant Diagnostic Studies: Coronary Angiogram 05/23/2015: No significant coronary artery disease. Minor, minimal luminal irregularity especially the right coronary and minimal in the proximal LAD. Normal LV systolic function. Ascending aorta normal without evidence of aortic regurgitation, no evidence of aortic dissection.  Hospital Course: Catherine Travis is a 59 y.o. female with history of hyperlipidemia, Type I uncontrolled Diabetes M ellitus who presents with chest pian and dyspnea, chest pain nitrate responsive and also rest pain and was seen in our office on an urgent basis associated with nausea and diaphoresis. Due to persistent chest discomfort she was admitted to the hospital, ruled out for myocardial infarction and brought to the cardiac catheterization lab to evaluate coronary anatomy.  About 2 weeks ago she had undergone noninvasive stress testing which did not reveal any significant ischemia by nuclear stress and a normal echocardiogram. She underwent coronary angiogram on 05/24/2015 with revealed only minimal luminal irregularities, especially in the RCA and in the proximal LAD. 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 After cath, she complained of hoarseness and sensation of swelling in her throat and was treated with one dose of IV steroids.  No recurrence of symptoms since. She is feeling well this morning with no new symptoms or concerns.  Recommendations on discharge: Due to uncontrolled diabetes, discussed the importance of aggressive risk factor modification with aspirin, statin, and ACE-i.  Pt is very opposed to taking any medications.  Discussed that  effect of diabetes on the progression of CAD. Will discuss further on follow up visit and initiate ACE-i at that time if BP remains stable.    Discharge Exam: Blood pressure 113/51, pulse 96, temperature 97.9 F (36.6 C), temperature source Oral, resp. rate 12, height 5' 3.5" (1.613 m), weight 67.8 kg (149 lb 7.6 oz), SpO2 94 %.    General appearance: alert, cooperative, appears stated age and no distress Eyes: negative Neck: no adenopathy, no carotid bruit, no JVD, supple, symmetrical, trachea midline and thyroid not enlarged, symmetric, no tenderness/mass/nodules Resp: clear to auscultation bilaterally Chest wall: no tenderness Cardio: regular rate and rhythm, S1, S2 normal, no murmur, click, rub or gallop GI: soft, non-tender; bowel sounds normal; no masses, no organomegaly Extremities: extremities normal, atraumatic, no cyanosis or edema Pulses: 2+ and symmetric, right radial access site asymptomatic Skin: Skin color, texture, turgor normal. No rashes or lesions Neurologic: Grossly normal  Labs:   Lab Results  Component Value Date   WBC 11.3* 05/23/2015   HGB 12.9 05/23/2015   HCT 39.0 05/23/2015   MCV 96.5 05/23/2015   PLT 263 05/23/2015    Recent Labs Lab 05/22/15 1847  NA 134*  K 4.5  CL 97*  CO2 25  BUN 15  CREATININE 0.96  CALCIUM 9.4  GLUCOSE 401*    Lipid Panel     Component Value Date/Time   CHOL 141 10/22/2013 0848   TRIG 103 10/22/2013 0848   HDL 53 10/22/2013 0848   CHOLHDL 2.7 10/22/2013 0848   VLDL 21 10/22/2013 0848   LDLCALC 67 10/22/2013 0848    BNP (last 3 results) No results for input(s): BNP in the last 8760 hours.  ProBNP (last 3 results) No results for input(s): PROBNP in the last 8760 hours.  HEMOGLOBIN A1C Lab Results  Component Value Date   HGBA1C 7.8* 05/22/2015   MPG 177 05/22/2015    Cardiac Panel (last 3 results)  Recent Labs  05/22/15 1847 05/23/15 0049 05/23/15 0745  TROPONINI <0.03 <0.03 <0.03    Lab  Results  Component Value Date   TROPONINI <0.03 05/23/2015     TSH  Recent Labs  05/22/15 1847  TSH 0.999    EKG 05/24/2015: Sinus rhythm at a rate at 98 bpm, normal axis, normal intervals, no evidence of ischemia.  PAC.   Radiology: No results found.    FOLLOW UP PLANS AND APPOINTMENTS Discharge Instructions    Discharge patient    Complete by:  As directed             Medication List    TAKE these medications        aspirin 81 MG EC tablet  Take 1 tablet (81 mg total) by mouth daily.     insulin aspart 100 UNIT/ML injection  Commonly known as:  novoLOG  Inject 0-9 Units into the skin 3 (three) times daily with meals. CBG < 70: Eat or drink something sweet and recheck,  CBG 70 - 120: 0 units CBG 121 - 150: 1 unit CBG 151 - 200: 2 units CBG 201 - 250: 3 units CBG 251 - 300: 5 units CBG 301 - 350: 7 units CBG 351 - 400: 9 units CBG > 400: call MD     insulin detemir 100 UNIT/ML injection  Commonly known as:  LEVEMIR  Inject 0.18 mLs (18 Units total) into the skin daily.     levothyroxine 50 MCG tablet  Commonly known as:  SYNTHROID, LEVOTHROID  Take 50 mcg by mouth daily before breakfast.     NITROSTAT 0.4 MG SL tablet  Generic drug:  nitroGLYCERIN  Place 1 tablet under the tongue every 5 (five) minutes as needed for chest pain. Every 5 minutes for chest pain up to 3 doses     rosuvastatin 20 MG tablet  Commonly known as:  CRESTOR  Take 1 tablet (20 mg total) by mouth daily at 6 PM.           Follow-up Information    Follow up with Woody Seller Sylvan Cheese, MD.   Specialty:  Cardiology   Why:  7-10 days, call for appt   Contact information:   [place] [id number] [phone]      Rachel Bo, NP-C 05/24/2015, 8:50 AM [facility] Pager: [phone] Office: [phone]

## 2015-05-24 NOTE — Discharge Instructions (Signed)
Angiogram An angiogram is an X-ray test. It is used to look at your blood vessels. For this test, a dye is put into the blood vessel being checked. The dye shows up on X-rays. It helps your doctor see if there is a blockage or other problem in the blood vessel. BEFORE THE PROCEDURE  Follow your doctor's instructions about limiting what you eat or drink.  Ask your doctor if you may drink enough water to take any needed medicines the morning of the test.  Plan to have someone take you home after the test.  If you go home the same day as the test, plan to have someone stay with you for 24 hours. PROCEDURE   An IV tube will be put into one of your veins.  You will be given a medicine that makes you relax (sedative).  Your skin will be washed and shaved where the thin tube (catheter) will be inserted. This will usually be done in the upper part of your leg (groin). It may also be done in your arm near the elbow or in your wrist.  You will be given a medicine that numbs the area where the tube will be inserted (local anesthetic).  The tube will be inserted into a blood vessel.  Using a type of X-ray (fluoroscopy) to see, your doctor will move the tube into the blood vessel to check it.  Dye will be put in through the tube. X-rays of your blood vessels will then be taken. Different health care providers and hospitals may do this procedure differently. AFTER THE PROCEDURE   If the test is done through the leg, you will be kept in bed lying flat for several hours. You will be told to not bend or cross your legs.   The area where the tube was inserted will be checked often.   The pulse in your feet or wrist will be checked often.   More tests or X-rays may be done.    This information is not intended to replace advice given to you by your health care provider. Make sure you discuss any questions you have with your health care provider.   Document Released: 10/17/2008 Document  Revised: 08/11/2014 Document Reviewed: 12/22/2012 Elsevier Interactive Patient Education 2016 [place].  Complementary and Alternative Medical Therapies for Diabetes Complementary and alternative medicines are health care practices or products that are not always accepted as part of routine medicine. Complementary medicine is used along with routine medicine (medical therapy). Alternative medicine can sometimes be used instead of routine medicine. Some people use these methods to treat diabetes. While some of these therapies may be effective, others may not be. Some may even be harmful. Patients using these methods need to tell their caregiver. It is important to let your caregivers know what you are doing. Some of these therapies are discussed below. For more information, talk with your caregiver. THERAPIES Acupuncture Acupuncture is done by a professional who inserts needles into certain points on the skin. Some scientists believe that this triggers the release of the body's natural painkillers. It has been shown to relieve long-term (chronic) pain. This may help patients with painful nerve damage caused by diabetes. Biofeedback Biofeedback helps a person become more aware of the body's response to pain. It also helps you learn to deal with the pain. This alternative therapy focuses on relaxation and stress-reduction techniques. Thinking of peaceful mental images (guided imagery) is one technique. Some people believe these images can ease their condition.  MEDICATIONS Chromium Several studies report that chromium supplements may improve diabetes control. Chromium helps insulin improve its action. Research is not yet certain. Supplements have not been recommended or approved. Caution is needed if you have kidney (renal) problems. Ginseng There are several types of ginseng plants. American ginseng is used for diabetes studies. Those studies have shown some glucose-lowering effects. Those effects  have been seen with fasting and after-meal blood glucose levels. They have also been seen in A1c levels (average blood glucose levels over a 103-month period). More long-term studies are needed before recommendations for use of ginseng can be made. Magnesium Experts have studied the relationship between magnesium and diabetes for many years. But it is not yet fully understood. Studies suggest that a low amount of magnesium may make blood glucose control worse in type 2 diabetes. Research also shows that a low amount may contribute to certain diabetes complications. One study showed that people who consume more magnesium had less risk of type 2 diabetes. Eating whole grains, nuts, and green leafy vegetables raises the magnesium level. Vanadium Vanadium is a compound found in tiny amounts in plants and animals. Early studies showed that vanadium improved blood glucose levels in animals with type 1 and type 2 diabetes. One study found that when given vanadium, those with diabetes were able to decrease their insulin dosage. Researchers still need to learn how it works in the body to discover any side effects, and to find safe dosages. Cinnamon There have been a couple of studies that seem to indicate cinnamon decreases insulin resistance and increases insulin production. By doing so, it may lower blood glucose. Exact doses are unknown, but it may work best when used in combination with other diabetes medicines.   This information is not intended to replace advice given to you by your health care provider. Make sure you discuss any questions you have with your health care provider.   Document Released: 05/18/2007 Document Revised: 10/13/2011 Document Reviewed: 05/31/2009 Elsevier Interactive Patient Education [employer].

## 2015-05-24 NOTE — Progress Notes (Signed)
PT's CBG 427. MD Ganji notified. 12 units of novolog ordered. VSS. Will continue to monitor.   Drako Maese, RN

## 2015-08-28 ENCOUNTER — Ambulatory Visit
Admission: RE | Admit: 2015-08-28 | Discharge: 2015-08-28 | Disposition: A | Discharge status: Home / Self Care | Payer: Medicare Other | Source: Ambulatory Visit | Attending: Family | Admitting: Family

## 2015-08-28 ENCOUNTER — Other Ambulatory Visit: Payer: Self-pay | Admitting: Family

## 2015-08-28 DIAGNOSIS — R05 Cough: Secondary | ICD-10-CM

## 2015-08-28 DIAGNOSIS — R059 Cough, unspecified: Secondary | ICD-10-CM

## 2017-08-27 ENCOUNTER — Other Ambulatory Visit: Payer: Self-pay

## 2017-08-27 ENCOUNTER — Encounter (HOSPITAL_COMMUNITY): Payer: Self-pay | Admitting: Emergency Medicine

## 2017-08-27 ENCOUNTER — Emergency Department (HOSPITAL_COMMUNITY)
Admission: EM | Admit: 2017-08-27 | Discharge: 2017-08-27 | Disposition: A | Discharge status: Home / Self Care | Payer: Medicare Other | Attending: Emergency Medicine | Admitting: Emergency Medicine

## 2017-08-27 DIAGNOSIS — F329 Major depressive disorder, single episode, unspecified: Secondary | ICD-10-CM | POA: Insufficient documentation

## 2017-08-27 DIAGNOSIS — E119 Type 2 diabetes mellitus without complications: Secondary | ICD-10-CM | POA: Diagnosis not present

## 2017-08-27 DIAGNOSIS — Z87891 Personal history of nicotine dependence: Secondary | ICD-10-CM | POA: Diagnosis not present

## 2017-08-27 DIAGNOSIS — J00 Acute nasopharyngitis [common cold]: Secondary | ICD-10-CM | POA: Diagnosis not present

## 2017-08-27 DIAGNOSIS — Z794 Long term (current) use of insulin: Secondary | ICD-10-CM | POA: Diagnosis not present

## 2017-08-27 DIAGNOSIS — Z8673 Personal history of transient ischemic attack (TIA), and cerebral infarction without residual deficits: Secondary | ICD-10-CM | POA: Insufficient documentation

## 2017-08-27 DIAGNOSIS — Z79899 Other long term (current) drug therapy: Secondary | ICD-10-CM | POA: Insufficient documentation

## 2017-08-27 DIAGNOSIS — Z7982 Long term (current) use of aspirin: Secondary | ICD-10-CM | POA: Insufficient documentation

## 2017-08-27 DIAGNOSIS — R0981 Nasal congestion: Secondary | ICD-10-CM | POA: Diagnosis present

## 2017-08-27 DIAGNOSIS — Z859 Personal history of malignant neoplasm, unspecified: Secondary | ICD-10-CM | POA: Diagnosis not present

## 2017-08-27 MED ORDER — MOMETASONE FUROATE 50 MCG/ACT NA SUSP: 2.0000 | Freq: Every day | NASAL | 0 refills | Status: AC

## 2017-08-27 MED ORDER — SALINE SPRAY 0.65 % NA SOLN: 1.0000 | NASAL | 0 refills | Status: AC | PRN

## 2017-08-27 MED ORDER — HYDROXYZINE HCL 25 MG PO TABS: 25.0000 mg | ORAL_TABLET | Freq: Four times a day (QID) | ORAL | 0 refills | Status: AC

## 2017-08-27 NOTE — ED Provider Notes (Signed)
[facility] EMERGENCY DEPARTMENT Provider Note   CSN: [id number] Arrival date & time: 08/27/17  2037     History   Chief Complaint Chief Complaint  Patient presents with  . Nasal Congestion    HPI Catherine Travis is a 62 y.o. female.  Patient received an injection of Eylea in both eyes by her ophthalmologist on Tuesday. Later that day she developed nasal congestion and sinus pressure, along with watery eyes. Patient concerned about possible allergic reaction.  No shortness of breath, wheezing, difficulty swallowing, urticaria, nausea or vomiting.   The history is provided by the patient. No language interpreter was used.  URI   This is a new problem. The current episode started 2 days ago. The problem has been gradually worsening. There has been no fever. Associated symptoms include congestion, rhinorrhea and sinus pain. Pertinent negatives include no chest pain, no nausea, no vomiting and no wheezing.    Past Medical History:  Diagnosis Date  . Cancer ([place])   . Diabetic acidosis, type II [facility])     Patient Active Problem List   Diagnosis Date Noted  . Unstable angina ([place]) 05/22/2015  . TIA (transient ischemic attack) 10/22/2013  . Stroke ([place]) 10/22/2013  . Insulin dependent diabetes mellitus ([place]) 10/22/2013  . DKA (diabetic ketoacidoses) ([place]) 10/22/2013  . Depression 10/22/2013    Past Surgical History:  Procedure Laterality Date  . ABDOMINAL HYSTERECTOMY    . CARDIAC CATHETERIZATION N/A 05/23/2015   Procedure: Left Heart Cath and Coronary Angiography;  Surgeon: Adrian Prows, MD;  Location: [place] [facility];  Service: Cardiovascular;  Laterality: N/A;  . MOUTH SURGERY    . PERIPHERAL VASCULAR CATHETERIZATION  05/23/2015   Procedure: Thoracic Aortogram;  Surgeon: Adrian Prows, MD;  Location: [place] [facility];  Service: Cardiovascular;;    OB History    No data available       Home Medications    Prior to Admission medications     Medication Sig Start Date End Date Taking? Authorizing Provider  aspirin EC 81 MG EC tablet Take 1 tablet (81 mg total) by mouth daily. 10/24/13   Hongalgi, Lenis Dickinson, MD  insulin aspart (NOVOLOG) 100 UNIT/ML injection Inject 0-9 Units into the skin 3 (three) times daily with meals. CBG < 70: Eat or drink something sweet and recheck,  CBG 70 - 120: 0 units CBG 121 - 150: 1 unit CBG 151 - 200: 2 units CBG 201 - 250: 3 units CBG 251 - 300: 5 units CBG 301 - 350: 7 units CBG 351 - 400: 9 units CBG > 400: call MD 10/24/13   Modena Jansky, MD  insulin detemir (LEVEMIR) 100 UNIT/ML injection Inject 0.18 mLs (18 Units total) into the skin daily. Patient taking differently: Inject 18 Units into the skin daily. 18 units in the morning and 14 in the afternoon 10/24/13   Hongalgi, Lenis Dickinson, MD  levothyroxine (SYNTHROID, LEVOTHROID) 50 MCG tablet Take 50 mcg by mouth daily before breakfast.    [provider]  NITROSTAT 0.4 MG SL tablet Place 1 tablet under the tongue every 5 (five) minutes as needed for chest pain. Every 5 minutes for chest pain up to 3 doses 03/19/15   [provider]  rosuvastatin (CRESTOR) 20 MG tablet Take 1 tablet (20 mg total) by mouth daily at 6 PM. 05/24/15   Neldon Labella, NP    Family History History reviewed. No pertinent family history.  Social History Social History   Tobacco  Use  . Smoking status: Former Smoker  . Smokeless tobacco: Never Used  Substance Use Topics  . Alcohol use: Yes  . Drug use: No     Allergies   Contrast media [iodinated diagnostic agents]   Review of Systems Review of Systems  HENT: Positive for congestion, rhinorrhea and sinus pain.   Eyes: Positive for discharge.  Respiratory: Negative for wheezing.   Cardiovascular: Negative for chest pain.  Gastrointestinal: Negative for nausea and vomiting.  All other systems reviewed and are negative.    Physical Exam Updated Vital Signs BP 125/64   Pulse 87    Temp 98.5 F (36.9 C) (Oral)   Resp 18   Ht 5\' 4"  (1.626 m)   Wt 72.6 kg (160 lb)   SpO2 95%   BMI 27.46 kg/m   Physical Exam  Constitutional: She is oriented to person, place, and time. She appears well-developed and well-nourished.  HENT:  Head: Normocephalic.  Nose: Mucosal edema and rhinorrhea present.  Eyes: EOM are normal. Pupils are equal, round, and reactive to light. Right conjunctiva is injected. Left conjunctiva is injected.  Neck: Neck supple.  Cardiovascular: Normal rate and regular rhythm.  Pulmonary/Chest: Effort normal and breath sounds normal. No respiratory distress. She has no wheezes.  Abdominal: Soft. Bowel sounds are normal.  Musculoskeletal: Normal range of motion. She exhibits no edema.  Lymphadenopathy:    She has no cervical adenopathy.  Neurological: She is alert and oriented to person, place, and time.  Skin: Skin is warm and dry.  Psychiatric: She has a normal mood and affect.  Nursing note and vitals reviewed.    ED Treatments / Results  Labs (all labs ordered are listed, but only abnormal results are displayed) Labs Reviewed - No data to display  EKG  EKG Interpretation None       Radiology No results found.  Procedures Procedures (including critical care time)  Medications Ordered in ED Medications - No data to display   Initial Impression / Assessment and Plan / ED Course  I have reviewed the triage vital signs and the nursing notes.  Pertinent labs & imaging results that were available during my care of the patient were reviewed by me and considered in my medical decision making (see chart for details).     Researched common and serious side effects of Eylea.  Eyes watering is common. Patient does not have eye pain, periorbital swelling, light sensitivity, floaters, numbness/weakness, headache, confusion, or problems with speech/balance.   Pt symptoms consistent with rhinitis. No indication of anaphylaxis.Pt will be  discharged with symptomatic treatment.  Discussed return precautions.   Pt is hemodynamically stable & in NAD prior to discharge.   Final Clinical Impressions(s) / ED Diagnoses   Final diagnoses:  Acute rhinitis    ED Discharge Orders        Ordered    mometasone (NASONEX) 50 MCG/ACT nasal spray  Daily     08/27/17 2243    hydrOXYzine (ATARAX/VISTARIL) 25 MG tablet  Every 6 hours     08/27/17 2243    sodium chloride (OCEAN) 0.65 % SOLN nasal spray  As needed     08/27/17 2243       Etta Quill, NP 08/27/17 2257    Etta Quill, NP 08/27/17 2258    Charlesetta Shanks, MD 09/01/17 [phone]

## 2017-08-27 NOTE — ED Triage Notes (Signed)
Pt reports having a stuffy nose, and watering eyes since Tuesday. She recently had injections in her eyes and is questioning whether this is a side effect of her injection or allergy/sinus related.

## 2019-12-13 ENCOUNTER — Other Ambulatory Visit: Payer: Self-pay

## 2019-12-13 ENCOUNTER — Ambulatory Visit
Admission: RE | Admit: 2019-12-13 | Discharge: 2019-12-13 | Disposition: A | Discharge status: Home / Self Care | Payer: Medicare Other | Source: Ambulatory Visit | Attending: Family Medicine | Admitting: Family Medicine

## 2019-12-13 ENCOUNTER — Other Ambulatory Visit: Payer: Self-pay | Admitting: Family Medicine

## 2019-12-13 DIAGNOSIS — M5440 Lumbago with sciatica, unspecified side: Secondary | ICD-10-CM

## 2019-12-16 ENCOUNTER — Other Ambulatory Visit: Payer: Self-pay | Admitting: Family Medicine

## 2019-12-16 DIAGNOSIS — M5440 Lumbago with sciatica, unspecified side: Secondary | ICD-10-CM

## 2019-12-27 ENCOUNTER — Other Ambulatory Visit: Payer: Self-pay

## 2019-12-27 ENCOUNTER — Ambulatory Visit
Admission: RE | Admit: 2019-12-27 | Discharge: 2019-12-27 | Disposition: A | Discharge status: Home / Self Care | Payer: Medicare Other | Source: Ambulatory Visit | Attending: Family Medicine | Admitting: Family Medicine

## 2019-12-27 DIAGNOSIS — M5440 Lumbago with sciatica, unspecified side: Secondary | ICD-10-CM

## 2020-09-18 DIAGNOSIS — E113512 Type 2 diabetes mellitus with proliferative diabetic retinopathy with macular edema, left eye: Secondary | ICD-10-CM | POA: Diagnosis not present

## 2020-11-09 IMAGING — MR MR LUMBAR SPINE W/O CM
4 of 5 series · 19 of 48 positions shown · non-contrast
Comparison: Lumbar radiographs 12/13/2019

CLINICAL DATA: Low back pain right greater than left. No prior back
surgery.

EXAM:
MRI LUMBAR SPINE WITHOUT CONTRAST
TECHNIQUE: Multiplanar, multisequence MR imaging of the lumbar spine was
performed. No intravenous contrast was administered.

[Series 5: T2 · sagittal · 4.0mm · 0.73mm/px · 6 of 15 slices shown (1 of 2)]
[im 1/15]
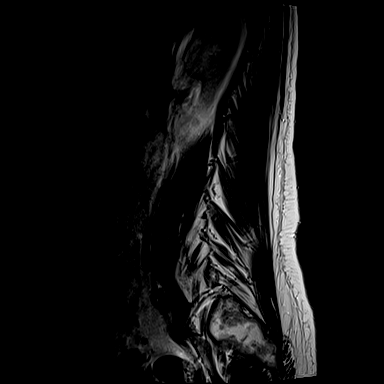
[im 3/15]
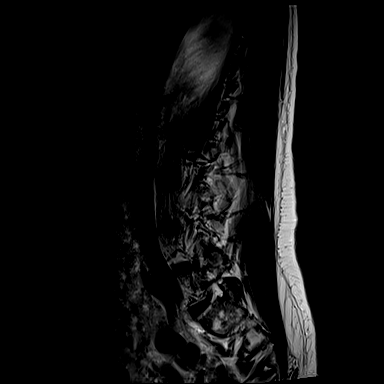
[im 6/15]
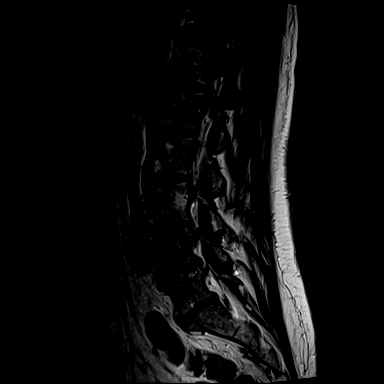
[im 9/15]
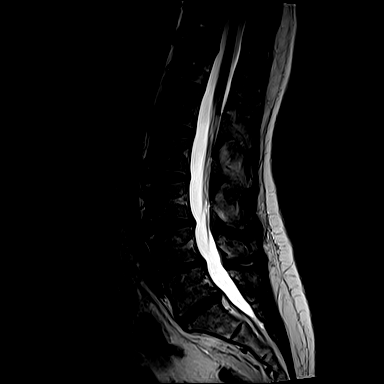
[im 12/15]
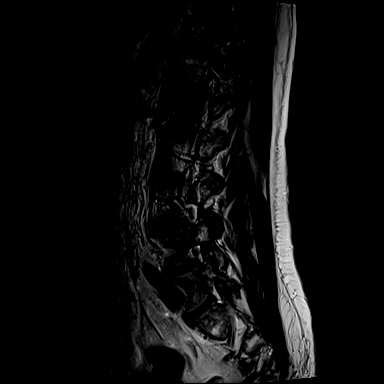
[im 15/15]
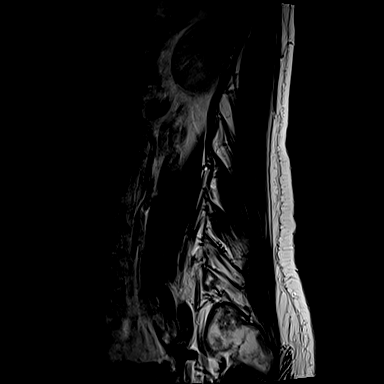

[Series 6: T1 · sagittal · 4.0mm · 0.73mm/px · 3 of 15 slices shown (1 of 2)]
[im 3/15]
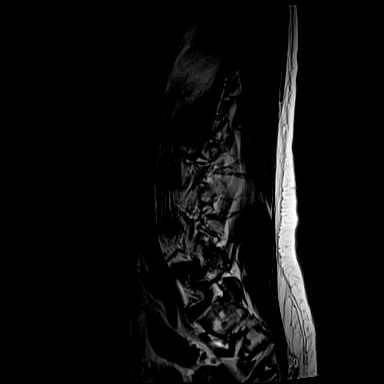
[im 9/15]
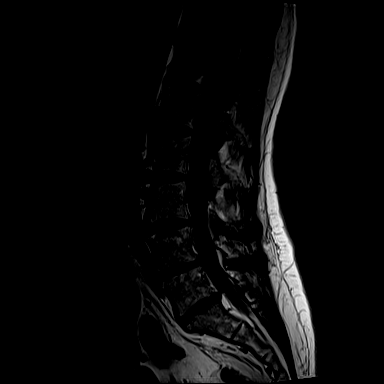
[im 15/15]
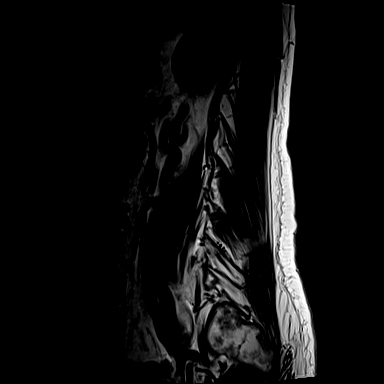

[Series 12: T2 · axial · 4.0mm · 0.28mm/px · z∈[-94,+87]mm · 7 of 39 slices shown (2 of 2)]
[im 1/39]
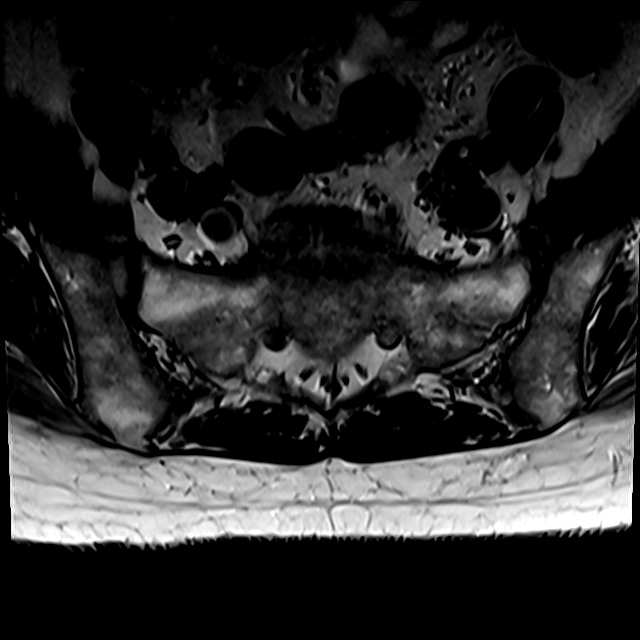
[im 6/39]
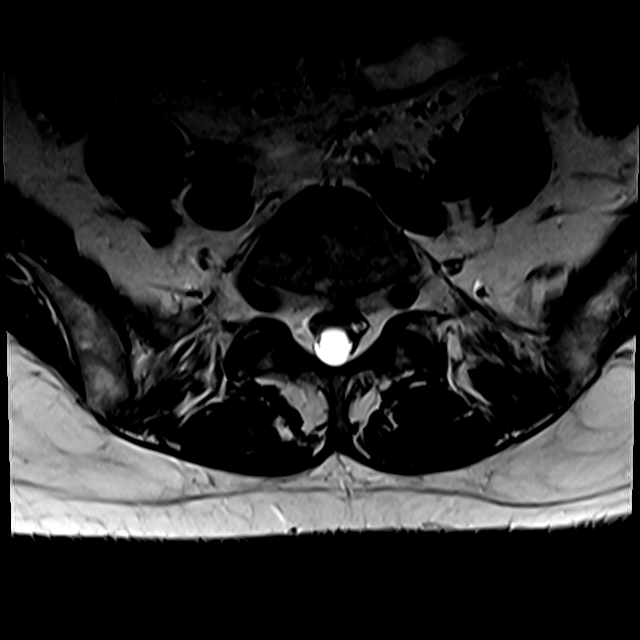
[im 11/39]
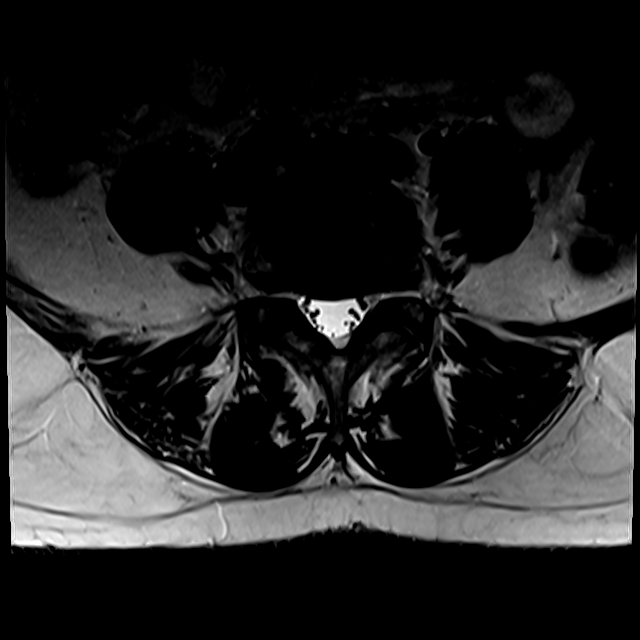
[im 17/39]
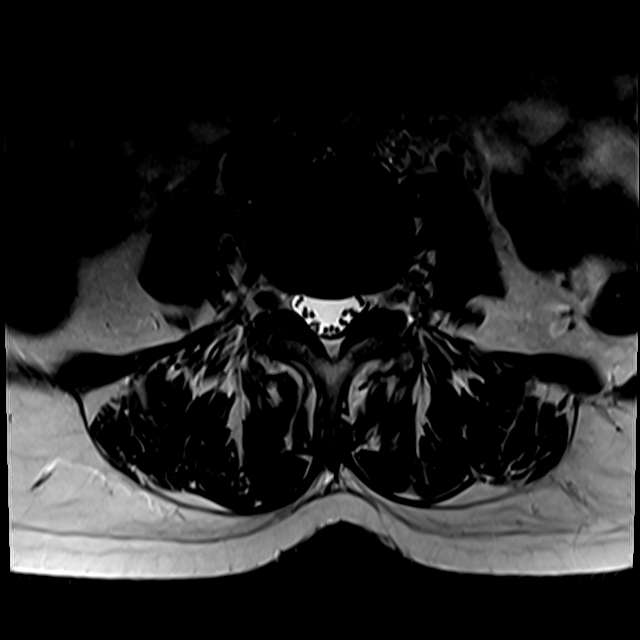
[im 20/39]
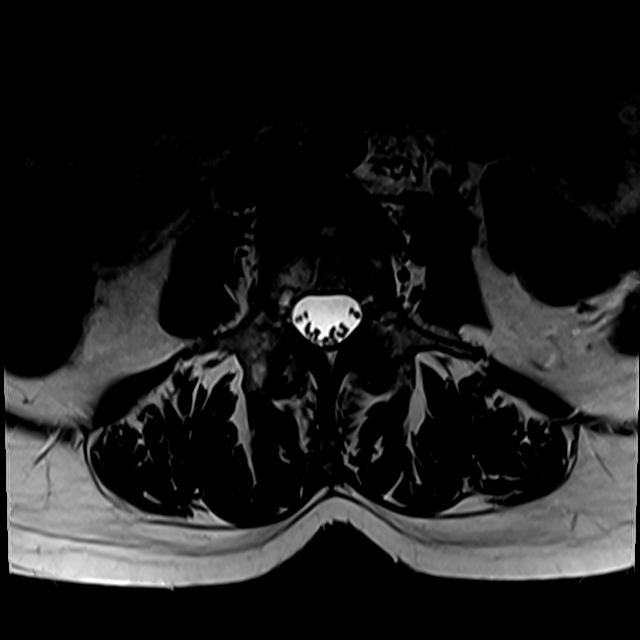
[im 22/39]
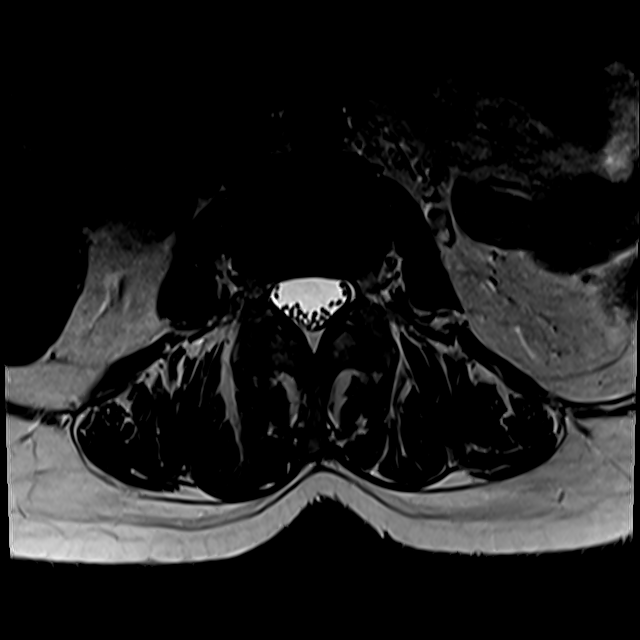
[im 33/39]
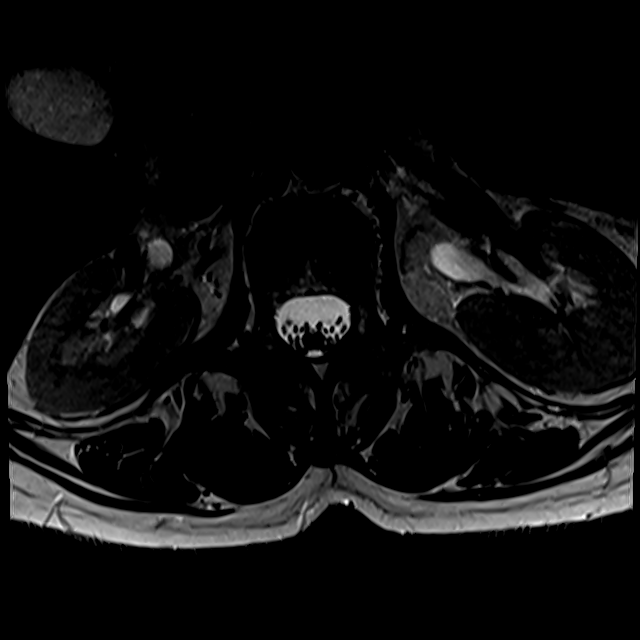

[Series 100: T1 · axial · 4.0mm · 0.28mm/px · z∈[-70,+87]mm · 3 of 39 slices shown (2 of 2)]
[im 6/39]
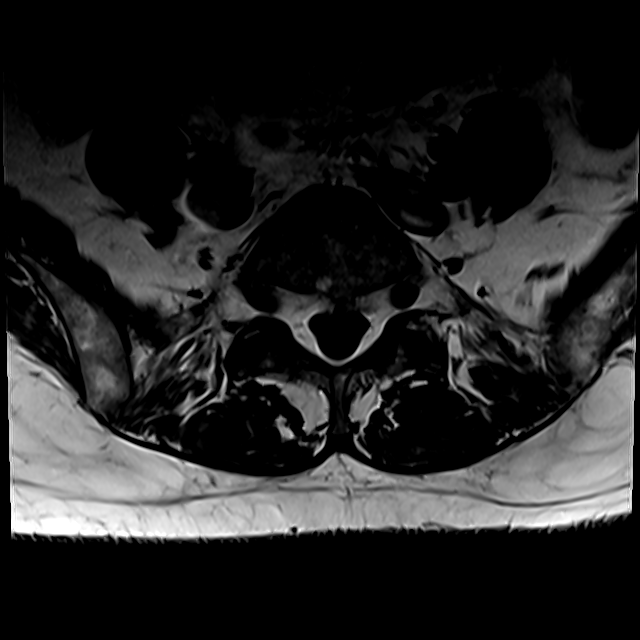
[im 20/39]
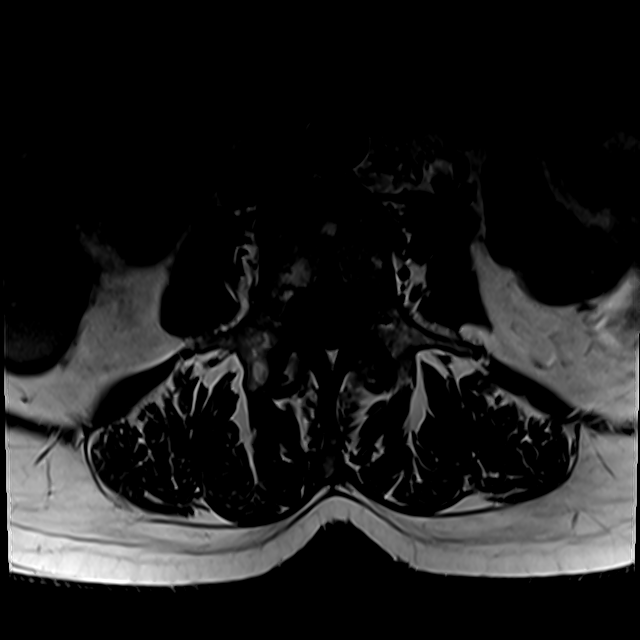
[im 33/39]
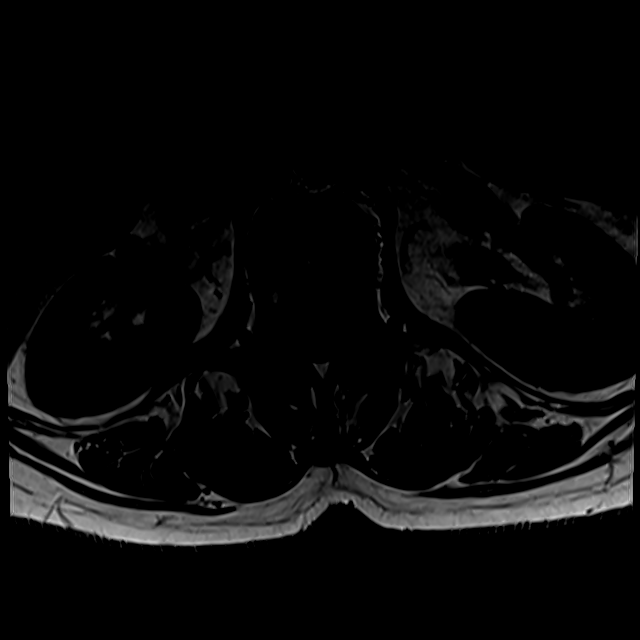

[19 of 48 positions shown; findings below may reference images not displayed]

FINDINGS: Segmentation:  Normal

Alignment:  Normal

Vertebrae:  Normal bone marrow.  Negative for fracture or mass.

Conus medullaris and cauda equina: Conus extends to the L1-2 level.
Conus and cauda equina appear normal.

Paraspinal and other soft tissues: Negative for paraspinous mass or
adenopathy.

Disc levels:

L1-2: Negative

L2-3: Mild disc and facet degeneration.  Negative for stenosis

L3-4: Mild disc bulging and shallow central disc protrusion. Mild
facet degeneration. No significant spinal or foraminal stenosis

L4-5: Moderate disc degeneration. Shallow central disc protrusion
extending to the right. Mild facet degeneration. Mild subarticular
stenosis bilaterally.

L5-S1: Mild disc degeneration with disc space narrowing and mild
endplate spurring. Negative for stenosis.
IMPRESSION: Mild degenerative changes L2-3 and L3-4

Shallow central and right-sided disc protrusion L4-5. Mild
subarticular stenosis bilaterally.

## 2020-12-11 DIAGNOSIS — H04123 Dry eye syndrome of bilateral lacrimal glands: Secondary | ICD-10-CM | POA: Diagnosis not present

## 2020-12-11 DIAGNOSIS — E113512 Type 2 diabetes mellitus with proliferative diabetic retinopathy with macular edema, left eye: Secondary | ICD-10-CM | POA: Diagnosis not present

## 2021-02-06 DIAGNOSIS — R072 Precordial pain: Secondary | ICD-10-CM | POA: Diagnosis not present

## 2021-02-06 DIAGNOSIS — Z794 Long term (current) use of insulin: Secondary | ICD-10-CM | POA: Diagnosis not present

## 2021-02-06 DIAGNOSIS — E039 Hypothyroidism, unspecified: Secondary | ICD-10-CM | POA: Diagnosis not present

## 2021-02-06 DIAGNOSIS — E1040 Type 1 diabetes mellitus with diabetic neuropathy, unspecified: Secondary | ICD-10-CM | POA: Diagnosis not present

## 2021-02-06 DIAGNOSIS — I10 Essential (primary) hypertension: Secondary | ICD-10-CM | POA: Diagnosis not present

## 2021-02-06 DIAGNOSIS — E785 Hyperlipidemia, unspecified: Secondary | ICD-10-CM | POA: Diagnosis not present

## 2021-02-06 DIAGNOSIS — Z7984 Long term (current) use of oral hypoglycemic drugs: Secondary | ICD-10-CM | POA: Diagnosis not present

## 2021-02-06 DIAGNOSIS — G629 Polyneuropathy, unspecified: Secondary | ICD-10-CM | POA: Diagnosis not present

## 2021-02-06 DIAGNOSIS — E11319 Type 2 diabetes mellitus with unspecified diabetic retinopathy without macular edema: Secondary | ICD-10-CM | POA: Diagnosis not present
